# Patient Record
Sex: Female | Born: 1960
Health system: Southern US, Community
[De-identification: ages and names within clinical notes are randomized; demographics above are authoritative.]

## PROBLEM LIST (undated history)

## (undated) DIAGNOSIS — K219 Gastro-esophageal reflux disease without esophagitis: Secondary | ICD-10-CM

## (undated) DIAGNOSIS — F419 Anxiety disorder, unspecified: Secondary | ICD-10-CM

## (undated) DIAGNOSIS — I10 Essential (primary) hypertension: Secondary | ICD-10-CM

## (undated) DIAGNOSIS — E079 Disorder of thyroid, unspecified: Secondary | ICD-10-CM

## (undated) DIAGNOSIS — M1990 Unspecified osteoarthritis, unspecified site: Secondary | ICD-10-CM

## (undated) DIAGNOSIS — F32A Depression, unspecified: Secondary | ICD-10-CM

## (undated) DIAGNOSIS — M2140 Flat foot [pes planus] (acquired), unspecified foot: Secondary | ICD-10-CM

## (undated) DIAGNOSIS — E785 Hyperlipidemia, unspecified: Secondary | ICD-10-CM

## (undated) DIAGNOSIS — R7989 Other specified abnormal findings of blood chemistry: Secondary | ICD-10-CM

## (undated) DIAGNOSIS — F329 Major depressive disorder, single episode, unspecified: Secondary | ICD-10-CM

## (undated) HISTORY — DX: Anxiety disorder, unspecified: F41.9

## (undated) HISTORY — DX: Other specified abnormal findings of blood chemistry: R79.89

## (undated) HISTORY — DX: Unspecified osteoarthritis, unspecified site: M19.90

## (undated) HISTORY — DX: Flat foot (pes planus) (acquired), unspecified foot: M21.40

## (undated) HISTORY — DX: Gastro-esophageal reflux disease without esophagitis: K21.9

---

## 1997-10-30 ENCOUNTER — Emergency Department (HOSPITAL_COMMUNITY): Admission: EM | Admit: 1997-10-30 | Discharge: 1997-10-30 | Payer: Self-pay | Admitting: Emergency Medicine

## 1998-12-30 ENCOUNTER — Emergency Department (HOSPITAL_COMMUNITY): Admission: EM | Admit: 1998-12-30 | Discharge: 1998-12-30 | Payer: Self-pay | Admitting: Emergency Medicine

## 2000-01-21 ENCOUNTER — Ambulatory Visit (HOSPITAL_COMMUNITY): Admission: RE | Admit: 2000-01-21 | Discharge: 2000-01-21 | Payer: Self-pay | Admitting: *Deleted

## 2000-10-24 ENCOUNTER — Ambulatory Visit (HOSPITAL_COMMUNITY): Admission: RE | Admit: 2000-10-24 | Discharge: 2000-10-24 | Payer: Self-pay | Admitting: Family Medicine

## 2001-04-26 ENCOUNTER — Encounter: Payer: Self-pay | Admitting: Family Medicine

## 2001-04-26 ENCOUNTER — Ambulatory Visit (HOSPITAL_COMMUNITY): Admission: RE | Admit: 2001-04-26 | Discharge: 2001-04-26 | Payer: Self-pay | Admitting: Family Medicine

## 2002-07-02 ENCOUNTER — Ambulatory Visit (HOSPITAL_COMMUNITY): Admission: RE | Admit: 2002-07-02 | Discharge: 2002-07-02 | Payer: Self-pay | Admitting: Internal Medicine

## 2002-07-02 ENCOUNTER — Encounter: Payer: Self-pay | Admitting: Internal Medicine

## 2003-02-20 ENCOUNTER — Encounter: Admission: RE | Admit: 2003-02-20 | Discharge: 2003-02-20 | Payer: Self-pay | Admitting: Sports Medicine

## 2003-03-21 ENCOUNTER — Encounter: Admission: RE | Admit: 2003-03-21 | Discharge: 2003-03-21 | Payer: Self-pay | Admitting: Family Medicine

## 2003-04-02 ENCOUNTER — Ambulatory Visit (HOSPITAL_COMMUNITY): Admission: RE | Admit: 2003-04-02 | Discharge: 2003-04-02 | Payer: Self-pay | Admitting: Sports Medicine

## 2003-04-10 ENCOUNTER — Encounter: Admission: RE | Admit: 2003-04-10 | Discharge: 2003-04-10 | Payer: Self-pay | Admitting: Sports Medicine

## 2003-08-07 ENCOUNTER — Encounter: Admission: RE | Admit: 2003-08-07 | Discharge: 2003-08-07 | Payer: Self-pay | Admitting: Family Medicine

## 2003-09-10 ENCOUNTER — Encounter: Admission: RE | Admit: 2003-09-10 | Discharge: 2003-09-10 | Payer: Self-pay | Admitting: Family Medicine

## 2003-10-10 ENCOUNTER — Ambulatory Visit: Payer: Self-pay | Admitting: Family Medicine

## 2003-11-13 ENCOUNTER — Ambulatory Visit: Payer: Self-pay | Admitting: Family Medicine

## 2003-12-26 ENCOUNTER — Ambulatory Visit: Payer: Self-pay | Admitting: Family Medicine

## 2004-06-01 ENCOUNTER — Ambulatory Visit: Payer: Self-pay | Admitting: Family Medicine

## 2004-06-15 ENCOUNTER — Ambulatory Visit (HOSPITAL_COMMUNITY): Admission: RE | Admit: 2004-06-15 | Discharge: 2004-06-15 | Payer: Self-pay | Admitting: Family Medicine

## 2004-08-10 ENCOUNTER — Ambulatory Visit: Payer: Self-pay | Admitting: Sports Medicine

## 2004-09-22 ENCOUNTER — Ambulatory Visit: Payer: Self-pay | Admitting: Family Medicine

## 2004-10-28 ENCOUNTER — Ambulatory Visit: Payer: Self-pay | Admitting: Family Medicine

## 2005-02-22 ENCOUNTER — Ambulatory Visit: Payer: Self-pay | Admitting: Sports Medicine

## 2005-04-12 ENCOUNTER — Ambulatory Visit: Payer: Self-pay | Admitting: Family Medicine

## 2005-07-06 ENCOUNTER — Ambulatory Visit: Payer: Self-pay | Admitting: Family Medicine

## 2005-08-23 ENCOUNTER — Encounter (INDEPENDENT_AMBULATORY_CARE_PROVIDER_SITE_OTHER): Payer: Self-pay | Admitting: *Deleted

## 2005-08-24 ENCOUNTER — Ambulatory Visit: Payer: Self-pay | Admitting: Family Medicine

## 2005-08-24 ENCOUNTER — Ambulatory Visit (HOSPITAL_COMMUNITY): Admission: RE | Admit: 2005-08-24 | Discharge: 2005-08-24 | Payer: Self-pay | Admitting: Family Medicine

## 2005-10-12 ENCOUNTER — Ambulatory Visit: Payer: Self-pay | Admitting: Family Medicine

## 2005-11-14 ENCOUNTER — Ambulatory Visit: Payer: Self-pay | Admitting: Sports Medicine

## 2005-12-15 ENCOUNTER — Ambulatory Visit: Payer: Self-pay | Admitting: Family Medicine

## 2006-01-03 ENCOUNTER — Ambulatory Visit: Payer: Self-pay | Admitting: Sports Medicine

## 2006-01-23 ENCOUNTER — Ambulatory Visit (HOSPITAL_COMMUNITY): Admission: RE | Admit: 2006-01-23 | Discharge: 2006-01-23 | Payer: Self-pay | Admitting: Family Medicine

## 2006-01-23 ENCOUNTER — Encounter: Payer: Self-pay | Admitting: Family Medicine

## 2006-01-23 ENCOUNTER — Ambulatory Visit: Payer: Self-pay | Admitting: Family Medicine

## 2006-01-23 LAB — CONVERTED CEMR LAB
BUN: 10 mg/dL (ref 6–23)
Calcium: 9 mg/dL (ref 8.4–10.5)
Creatinine, Ser: 0.84 mg/dL (ref 0.40–1.20)
Sodium: 136 meq/L (ref 135–145)
TSH: 0.405 microintl units/mL (ref 0.350–5.50)

## 2006-02-22 ENCOUNTER — Ambulatory Visit: Payer: Self-pay | Admitting: Family Medicine

## 2006-02-22 ENCOUNTER — Encounter: Payer: Self-pay | Admitting: Family Medicine

## 2006-02-22 LAB — CONVERTED CEMR LAB
Cholesterol: 165 mg/dL (ref 0–200)
Triglycerides: 62 mg/dL (ref <150)

## 2006-03-16 DIAGNOSIS — N951 Menopausal and female climacteric states: Secondary | ICD-10-CM

## 2006-03-16 DIAGNOSIS — I10 Essential (primary) hypertension: Secondary | ICD-10-CM | POA: Insufficient documentation

## 2006-03-16 DIAGNOSIS — F339 Major depressive disorder, recurrent, unspecified: Secondary | ICD-10-CM | POA: Insufficient documentation

## 2006-03-16 DIAGNOSIS — E669 Obesity, unspecified: Secondary | ICD-10-CM | POA: Insufficient documentation

## 2006-03-16 HISTORY — DX: Menopausal and female climacteric states: N95.1

## 2006-03-17 ENCOUNTER — Encounter (INDEPENDENT_AMBULATORY_CARE_PROVIDER_SITE_OTHER): Payer: Self-pay | Admitting: *Deleted

## 2006-04-24 ENCOUNTER — Telehealth: Payer: Self-pay | Admitting: *Deleted

## 2006-04-25 ENCOUNTER — Emergency Department (HOSPITAL_COMMUNITY): Admission: EM | Admit: 2006-04-25 | Discharge: 2006-04-25 | Payer: Self-pay | Admitting: Emergency Medicine

## 2006-05-15 ENCOUNTER — Telehealth: Payer: Self-pay | Admitting: *Deleted

## 2006-05-18 ENCOUNTER — Telehealth: Payer: Self-pay | Admitting: *Deleted

## 2006-05-25 ENCOUNTER — Telehealth (INDEPENDENT_AMBULATORY_CARE_PROVIDER_SITE_OTHER): Payer: Self-pay | Admitting: *Deleted

## 2006-05-26 ENCOUNTER — Ambulatory Visit: Payer: Self-pay | Admitting: Sports Medicine

## 2006-06-29 ENCOUNTER — Encounter: Payer: Self-pay | Admitting: Family Medicine

## 2006-07-31 ENCOUNTER — Ambulatory Visit: Payer: Self-pay | Admitting: Family Medicine

## 2006-07-31 ENCOUNTER — Telehealth: Payer: Self-pay | Admitting: *Deleted

## 2006-08-14 ENCOUNTER — Telehealth (INDEPENDENT_AMBULATORY_CARE_PROVIDER_SITE_OTHER): Payer: Self-pay | Admitting: *Deleted

## 2006-08-28 ENCOUNTER — Telehealth (INDEPENDENT_AMBULATORY_CARE_PROVIDER_SITE_OTHER): Payer: Self-pay | Admitting: *Deleted

## 2006-08-28 ENCOUNTER — Encounter (INDEPENDENT_AMBULATORY_CARE_PROVIDER_SITE_OTHER): Payer: Self-pay | Admitting: Family Medicine

## 2006-08-28 ENCOUNTER — Ambulatory Visit: Payer: Self-pay | Admitting: Family Medicine

## 2006-08-28 ENCOUNTER — Encounter: Payer: Self-pay | Admitting: *Deleted

## 2006-08-30 ENCOUNTER — Encounter (INDEPENDENT_AMBULATORY_CARE_PROVIDER_SITE_OTHER): Payer: Self-pay | Admitting: Family Medicine

## 2006-08-31 ENCOUNTER — Encounter (INDEPENDENT_AMBULATORY_CARE_PROVIDER_SITE_OTHER): Payer: Self-pay | Admitting: Family Medicine

## 2006-08-31 LAB — CONVERTED CEMR LAB
BUN: 16 mg/dL (ref 6–23)
CO2: 28 meq/L (ref 19–32)
Calcium: 9.6 mg/dL (ref 8.4–10.5)
Hemoglobin: 11.6 g/dL — ABNORMAL LOW (ref 12.0–15.0)
MCV: 83.6 fL (ref 78.0–100.0)
Potassium: 3.5 meq/L (ref 3.5–5.3)
RBC: 4.38 M/uL (ref 3.87–5.11)
Sodium: 140 meq/L (ref 135–145)
TSH: 0.319 microintl units/mL — ABNORMAL LOW (ref 0.350–5.50)
WBC: 8.4 10*3/uL (ref 4.0–10.5)

## 2006-09-13 ENCOUNTER — Telehealth: Payer: Self-pay | Admitting: *Deleted

## 2006-09-25 ENCOUNTER — Telehealth: Payer: Self-pay | Admitting: *Deleted

## 2006-09-25 ENCOUNTER — Ambulatory Visit: Payer: Self-pay | Admitting: Family Medicine

## 2006-09-25 ENCOUNTER — Encounter (INDEPENDENT_AMBULATORY_CARE_PROVIDER_SITE_OTHER): Payer: Self-pay | Admitting: Family Medicine

## 2006-09-25 ENCOUNTER — Encounter: Payer: Self-pay | Admitting: *Deleted

## 2006-09-25 LAB — CONVERTED CEMR LAB
Potassium: 3.7 meq/L (ref 3.5–5.3)
Sodium: 138 meq/L (ref 135–145)

## 2006-09-26 ENCOUNTER — Telehealth: Payer: Self-pay | Admitting: *Deleted

## 2006-09-28 ENCOUNTER — Telehealth: Payer: Self-pay | Admitting: *Deleted

## 2006-10-04 ENCOUNTER — Encounter: Payer: Self-pay | Admitting: Family Medicine

## 2006-10-04 ENCOUNTER — Ambulatory Visit: Payer: Self-pay | Admitting: Family Medicine

## 2006-10-05 ENCOUNTER — Encounter: Payer: Self-pay | Admitting: Family Medicine

## 2006-10-17 ENCOUNTER — Telehealth: Payer: Self-pay | Admitting: *Deleted

## 2006-10-19 ENCOUNTER — Ambulatory Visit: Payer: Self-pay | Admitting: Family Medicine

## 2006-10-19 ENCOUNTER — Telehealth (INDEPENDENT_AMBULATORY_CARE_PROVIDER_SITE_OTHER): Payer: Self-pay | Admitting: *Deleted

## 2006-10-19 ENCOUNTER — Encounter (INDEPENDENT_AMBULATORY_CARE_PROVIDER_SITE_OTHER): Payer: Self-pay | Admitting: Family Medicine

## 2006-10-30 ENCOUNTER — Ambulatory Visit (HOSPITAL_COMMUNITY): Admission: RE | Admit: 2006-10-30 | Discharge: 2006-10-30 | Payer: Self-pay | Admitting: Family Medicine

## 2006-11-03 ENCOUNTER — Encounter: Payer: Self-pay | Admitting: Family Medicine

## 2006-11-21 ENCOUNTER — Telehealth: Payer: Self-pay | Admitting: *Deleted

## 2006-11-23 ENCOUNTER — Ambulatory Visit: Payer: Self-pay | Admitting: Family Medicine

## 2006-11-23 ENCOUNTER — Encounter: Payer: Self-pay | Admitting: Family Medicine

## 2006-11-23 LAB — CONVERTED CEMR LAB
Chlamydia, DNA Probe: NEGATIVE
GC Probe Amp, Genital: NEGATIVE
HCT: 41.3 % (ref 36.0–46.0)
Hemoglobin: 13.1 g/dL (ref 12.0–15.0)
MCHC: 31.7 g/dL (ref 30.0–36.0)
MCV: 87.5 fL (ref 78.0–100.0)
Platelets: 280 10*3/uL (ref 150–400)
RBC: 4.72 M/uL (ref 3.87–5.11)
RDW: 15.6 % — ABNORMAL HIGH (ref 11.5–14.0)

## 2006-11-28 ENCOUNTER — Encounter: Payer: Self-pay | Admitting: Family Medicine

## 2007-02-09 ENCOUNTER — Telehealth (INDEPENDENT_AMBULATORY_CARE_PROVIDER_SITE_OTHER): Payer: Self-pay | Admitting: *Deleted

## 2007-02-13 ENCOUNTER — Ambulatory Visit: Payer: Self-pay | Admitting: Family Medicine

## 2007-03-09 ENCOUNTER — Telehealth: Payer: Self-pay | Admitting: *Deleted

## 2007-03-13 ENCOUNTER — Ambulatory Visit: Payer: Self-pay | Admitting: Family Medicine

## 2007-04-10 ENCOUNTER — Ambulatory Visit: Payer: Self-pay | Admitting: Family Medicine

## 2007-04-10 ENCOUNTER — Encounter: Payer: Self-pay | Admitting: Family Medicine

## 2007-04-10 ENCOUNTER — Ambulatory Visit (HOSPITAL_COMMUNITY): Admission: RE | Admit: 2007-04-10 | Discharge: 2007-04-10 | Payer: Self-pay | Admitting: Family Medicine

## 2007-04-10 LAB — CONVERTED CEMR LAB
Calcium: 8.8 mg/dL (ref 8.4–10.5)
Creatinine, Ser: 0.71 mg/dL (ref 0.40–1.20)
Free T4: 0.98 ng/dL (ref 0.89–1.80)
Glucose, Bld: 80 mg/dL (ref 70–99)
T3, Free: 2.6 pg/mL (ref 2.3–4.2)
TSH: 0.135 microintl units/mL — ABNORMAL LOW (ref 0.350–5.50)

## 2007-04-19 ENCOUNTER — Encounter: Payer: Self-pay | Admitting: Family Medicine

## 2007-05-14 ENCOUNTER — Encounter: Payer: Self-pay | Admitting: Family Medicine

## 2007-07-09 ENCOUNTER — Ambulatory Visit: Payer: Self-pay | Admitting: Sports Medicine

## 2007-07-18 ENCOUNTER — Encounter: Payer: Self-pay | Admitting: Family Medicine

## 2007-07-18 ENCOUNTER — Ambulatory Visit: Payer: Self-pay | Admitting: Family Medicine

## 2007-07-18 LAB — CONVERTED CEMR LAB
HDL: 46 mg/dL (ref >39)
Triglycerides: 47 mg/dL (ref <150)
VLDL: 9 mg/dL (ref 0–40)

## 2007-07-19 ENCOUNTER — Encounter: Payer: Self-pay | Admitting: Family Medicine

## 2007-10-12 ENCOUNTER — Telehealth (INDEPENDENT_AMBULATORY_CARE_PROVIDER_SITE_OTHER): Payer: Self-pay | Admitting: *Deleted

## 2007-10-15 ENCOUNTER — Ambulatory Visit: Payer: Self-pay | Admitting: Family Medicine

## 2007-10-23 ENCOUNTER — Encounter: Payer: Self-pay | Admitting: Family Medicine

## 2007-11-01 ENCOUNTER — Encounter: Payer: Self-pay | Admitting: Family Medicine

## 2007-11-01 ENCOUNTER — Ambulatory Visit (HOSPITAL_COMMUNITY): Admission: RE | Admit: 2007-11-01 | Discharge: 2007-11-01 | Payer: Self-pay | Admitting: Family Medicine

## 2007-11-01 ENCOUNTER — Ambulatory Visit: Payer: Self-pay | Admitting: Family Medicine

## 2007-11-01 DIAGNOSIS — N938 Other specified abnormal uterine and vaginal bleeding: Secondary | ICD-10-CM | POA: Insufficient documentation

## 2007-11-01 DIAGNOSIS — N949 Unspecified condition associated with female genital organs and menstrual cycle: Secondary | ICD-10-CM

## 2007-11-06 ENCOUNTER — Telehealth: Payer: Self-pay | Admitting: *Deleted

## 2007-11-13 ENCOUNTER — Encounter: Admission: RE | Admit: 2007-11-13 | Discharge: 2007-11-13 | Payer: Self-pay | Admitting: Family Medicine

## 2007-11-21 LAB — CONVERTED CEMR LAB
HCT: 41.9 % (ref 36.0–46.0)
Hemoglobin: 13 g/dL (ref 12.0–15.0)
RBC: 4.71 M/uL (ref 3.87–5.11)

## 2007-12-04 ENCOUNTER — Ambulatory Visit (HOSPITAL_COMMUNITY): Admission: RE | Admit: 2007-12-04 | Discharge: 2007-12-04 | Payer: Self-pay | Admitting: Family Medicine

## 2007-12-25 ENCOUNTER — Encounter: Payer: Self-pay | Admitting: Family Medicine

## 2007-12-25 ENCOUNTER — Encounter (INDEPENDENT_AMBULATORY_CARE_PROVIDER_SITE_OTHER): Payer: Self-pay | Admitting: Family Medicine

## 2007-12-25 ENCOUNTER — Ambulatory Visit: Payer: Self-pay | Admitting: Family Medicine

## 2007-12-28 ENCOUNTER — Telehealth: Payer: Self-pay | Admitting: *Deleted

## 2008-01-14 ENCOUNTER — Encounter: Payer: Self-pay | Admitting: Family Medicine

## 2008-01-24 ENCOUNTER — Encounter: Payer: Self-pay | Admitting: Family Medicine

## 2008-01-24 ENCOUNTER — Ambulatory Visit: Payer: Self-pay | Admitting: Family Medicine

## 2008-01-24 LAB — CONVERTED CEMR LAB
FSH: 6.8 milliintl units/mL
Hemoglobin: 12.4 g/dL (ref 12.0–15.0)
MCHC: 31.9 g/dL (ref 30.0–36.0)
MCV: 87 fL (ref 78.0–100.0)
Platelets: 279 10*3/uL (ref 150–400)

## 2008-01-25 ENCOUNTER — Encounter: Payer: Self-pay | Admitting: Family Medicine

## 2008-01-28 ENCOUNTER — Telehealth: Payer: Self-pay | Admitting: *Deleted

## 2008-04-26 ENCOUNTER — Emergency Department (HOSPITAL_COMMUNITY): Admission: EM | Admit: 2008-04-26 | Discharge: 2008-04-26 | Payer: Self-pay | Admitting: Emergency Medicine

## 2008-04-28 ENCOUNTER — Telehealth: Payer: Self-pay | Admitting: *Deleted

## 2008-05-20 ENCOUNTER — Encounter: Payer: Self-pay | Admitting: Family Medicine

## 2008-05-20 ENCOUNTER — Encounter: Admission: RE | Admit: 2008-05-20 | Discharge: 2008-05-20 | Payer: Self-pay | Admitting: Family Medicine

## 2008-05-20 ENCOUNTER — Ambulatory Visit: Payer: Self-pay | Admitting: Family Medicine

## 2008-05-20 DIAGNOSIS — R928 Other abnormal and inconclusive findings on diagnostic imaging of breast: Secondary | ICD-10-CM | POA: Insufficient documentation

## 2008-06-06 ENCOUNTER — Telehealth: Payer: Self-pay | Admitting: *Deleted

## 2008-08-06 ENCOUNTER — Telehealth: Payer: Self-pay | Admitting: Family Medicine

## 2008-08-12 ENCOUNTER — Ambulatory Visit: Payer: Self-pay | Admitting: Family Medicine

## 2008-09-01 ENCOUNTER — Ambulatory Visit: Payer: Self-pay | Admitting: Family Medicine

## 2008-09-01 ENCOUNTER — Encounter: Payer: Self-pay | Admitting: Family Medicine

## 2008-09-10 LAB — CONVERTED CEMR LAB
BUN: 8 mg/dL (ref 6–23)
CO2: 25 meq/L (ref 19–32)
Calcium: 8.8 mg/dL (ref 8.4–10.5)
Chloride: 106 meq/L (ref 96–112)
Creatinine, Ser: 0.64 mg/dL (ref 0.40–1.20)
Sodium: 140 meq/L (ref 135–145)

## 2008-09-11 ENCOUNTER — Telehealth: Payer: Self-pay | Admitting: Family Medicine

## 2008-09-12 ENCOUNTER — Telehealth: Payer: Self-pay | Admitting: Family Medicine

## 2008-09-12 ENCOUNTER — Emergency Department (HOSPITAL_COMMUNITY): Admission: EM | Admit: 2008-09-12 | Discharge: 2008-09-12 | Payer: Self-pay | Admitting: Family Medicine

## 2008-09-24 ENCOUNTER — Encounter: Payer: Self-pay | Admitting: Family Medicine

## 2008-10-03 ENCOUNTER — Telehealth: Payer: Self-pay | Admitting: Family Medicine

## 2008-11-18 ENCOUNTER — Encounter: Payer: Self-pay | Admitting: Family Medicine

## 2008-11-18 ENCOUNTER — Encounter: Admission: RE | Admit: 2008-11-18 | Discharge: 2008-11-18 | Payer: Self-pay | Admitting: Family Medicine

## 2008-11-18 ENCOUNTER — Ambulatory Visit: Payer: Self-pay | Admitting: Family Medicine

## 2008-11-18 DIAGNOSIS — E785 Hyperlipidemia, unspecified: Secondary | ICD-10-CM | POA: Insufficient documentation

## 2008-12-05 ENCOUNTER — Telehealth: Payer: Self-pay | Admitting: Family Medicine

## 2008-12-15 ENCOUNTER — Telehealth: Payer: Self-pay | Admitting: Family Medicine

## 2008-12-30 ENCOUNTER — Encounter: Payer: Self-pay | Admitting: Family Medicine

## 2008-12-30 ENCOUNTER — Ambulatory Visit: Payer: Self-pay | Admitting: Family Medicine

## 2008-12-30 ENCOUNTER — Encounter: Payer: Self-pay | Admitting: *Deleted

## 2009-01-15 LAB — CONVERTED CEMR LAB
ALT: <8 units/L (ref 0–35)
Alkaline Phosphatase: 72 units/L (ref 39–117)
CO2: 20 meq/L (ref 19–32)
Calcium: 8.7 mg/dL (ref 8.4–10.5)
Chloride: 107 meq/L (ref 96–112)
Cholesterol: 176 mg/dL (ref 0–200)
LDL Cholesterol: 127 mg/dL — ABNORMAL HIGH (ref 0–99)
Potassium: 4.3 meq/L (ref 3.5–5.3)
Total Protein: 6.8 g/dL (ref 6.0–8.3)
Triglycerides: 42 mg/dL (ref <150)

## 2009-03-02 ENCOUNTER — Telehealth (INDEPENDENT_AMBULATORY_CARE_PROVIDER_SITE_OTHER): Payer: Self-pay | Admitting: *Deleted

## 2009-04-07 ENCOUNTER — Ambulatory Visit: Payer: Self-pay | Admitting: Family Medicine

## 2009-04-07 ENCOUNTER — Encounter: Payer: Self-pay | Admitting: Family Medicine

## 2009-04-08 LAB — CONVERTED CEMR LAB
Albumin: 4.1 g/dL (ref 3.5–5.2)
Alkaline Phosphatase: 54 units/L (ref 39–117)
CO2: 21 meq/L (ref 19–32)
Chloride: 103 meq/L (ref 96–112)
Creatinine, Ser: 0.69 mg/dL (ref 0.40–1.20)
HCT: 38.5 % (ref 36.0–46.0)
Hemoglobin: 12.4 g/dL (ref 12.0–15.0)
MCHC: 32.2 g/dL (ref 30.0–36.0)
Platelets: 260 10*3/uL (ref 150–400)
RDW: 14.1 % (ref 11.5–15.5)
Total Bilirubin: 0.5 mg/dL (ref 0.3–1.2)

## 2009-04-09 ENCOUNTER — Telehealth: Payer: Self-pay | Admitting: *Deleted

## 2009-04-13 ENCOUNTER — Ambulatory Visit: Payer: Self-pay | Admitting: Family Medicine

## 2009-04-13 DIAGNOSIS — M25562 Pain in left knee: Secondary | ICD-10-CM | POA: Insufficient documentation

## 2009-04-13 DIAGNOSIS — M25569 Pain in unspecified knee: Secondary | ICD-10-CM | POA: Insufficient documentation

## 2009-06-08 ENCOUNTER — Ambulatory Visit: Payer: Self-pay | Admitting: Family Medicine

## 2009-10-05 ENCOUNTER — Telehealth: Payer: Self-pay | Admitting: Family Medicine

## 2009-10-06 ENCOUNTER — Ambulatory Visit: Payer: Self-pay | Admitting: Family Medicine

## 2009-10-14 ENCOUNTER — Encounter: Payer: Self-pay | Admitting: Family Medicine

## 2009-10-14 ENCOUNTER — Ambulatory Visit: Payer: Self-pay | Admitting: Family Medicine

## 2009-10-18 ENCOUNTER — Encounter: Payer: Self-pay | Admitting: Family Medicine

## 2009-10-18 LAB — CONVERTED CEMR LAB: TSH: 0.17 microintl units/mL — ABNORMAL LOW (ref 0.350–4.500)

## 2009-11-12 ENCOUNTER — Encounter: Payer: Self-pay | Admitting: Family Medicine

## 2009-11-12 ENCOUNTER — Ambulatory Visit: Payer: Self-pay | Admitting: Family Medicine

## 2009-11-13 ENCOUNTER — Encounter: Payer: Self-pay | Admitting: Family Medicine

## 2009-11-13 LAB — CONVERTED CEMR LAB: T3, Free: 2.8 pg/mL (ref 2.3–4.2)

## 2009-11-26 ENCOUNTER — Ambulatory Visit: Payer: Self-pay | Admitting: Family Medicine

## 2009-11-29 ENCOUNTER — Encounter: Payer: Self-pay | Admitting: Family Medicine

## 2009-11-30 ENCOUNTER — Ambulatory Visit: Payer: Self-pay | Admitting: Family Medicine

## 2009-11-30 ENCOUNTER — Ambulatory Visit (HOSPITAL_COMMUNITY): Admission: RE | Admit: 2009-11-30 | Discharge: 2009-11-30 | Payer: Self-pay | Admitting: Family Medicine

## 2009-11-30 IMAGING — MG MM DIGITAL SCREENING
5 series · 5 of 5 positions shown · non-contrast
Comparison: Prior studies.

DG SCREEN MAMMOGRAM BILATERAL
Bilateral CC and MLO view(s) were taken.
Technologist: NYA, RT RM
Prior study comparison: [DATE], DG screen mammogram bilateral.

DIGITAL SCREENING MAMMOGRAM WITH CAD:

[R CC]
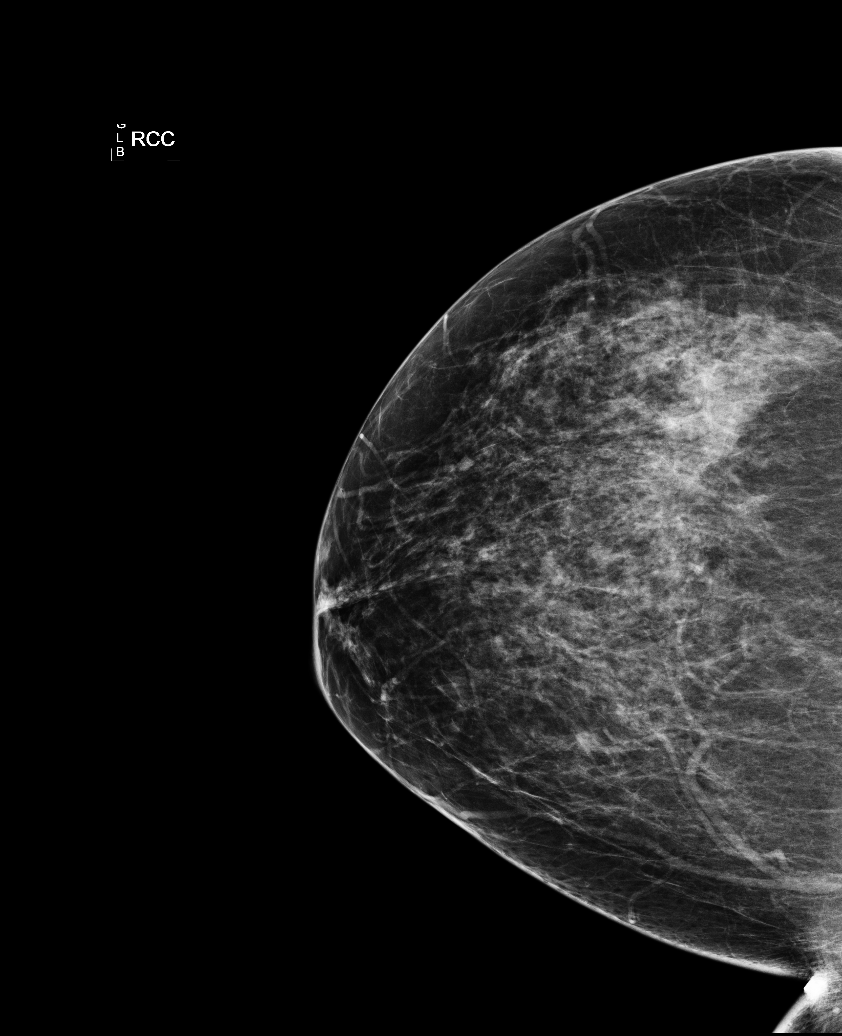

[R MLO]
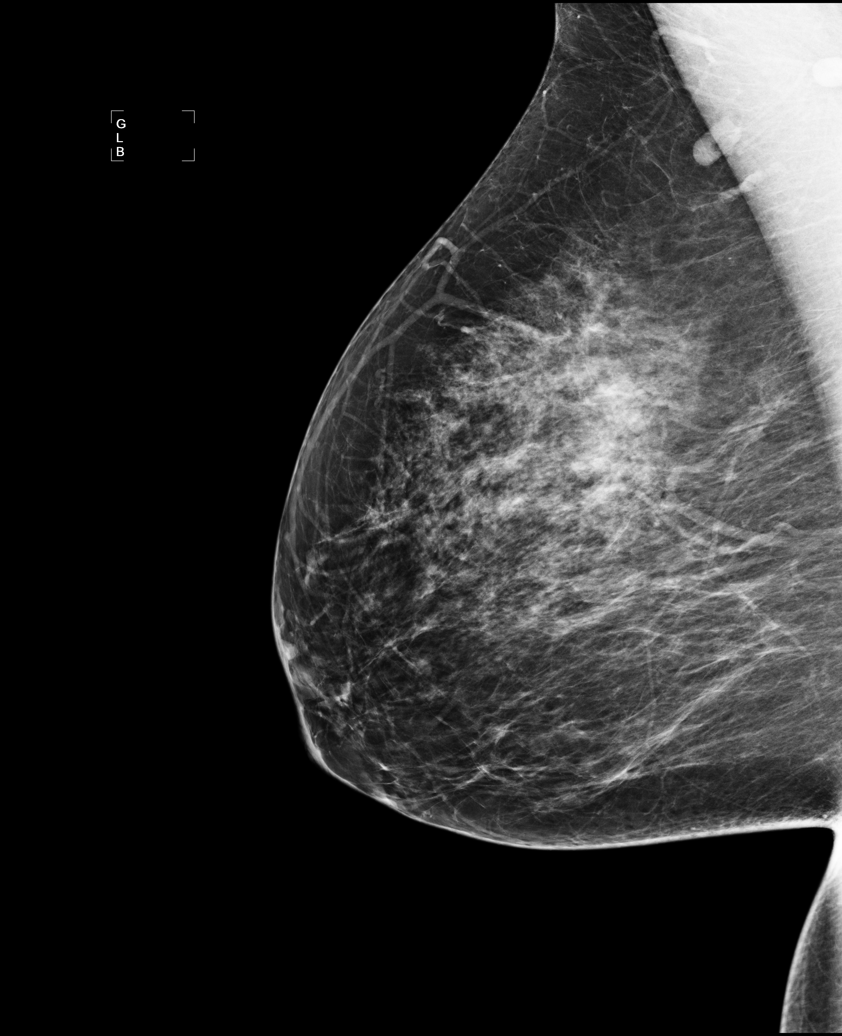

[L CC]
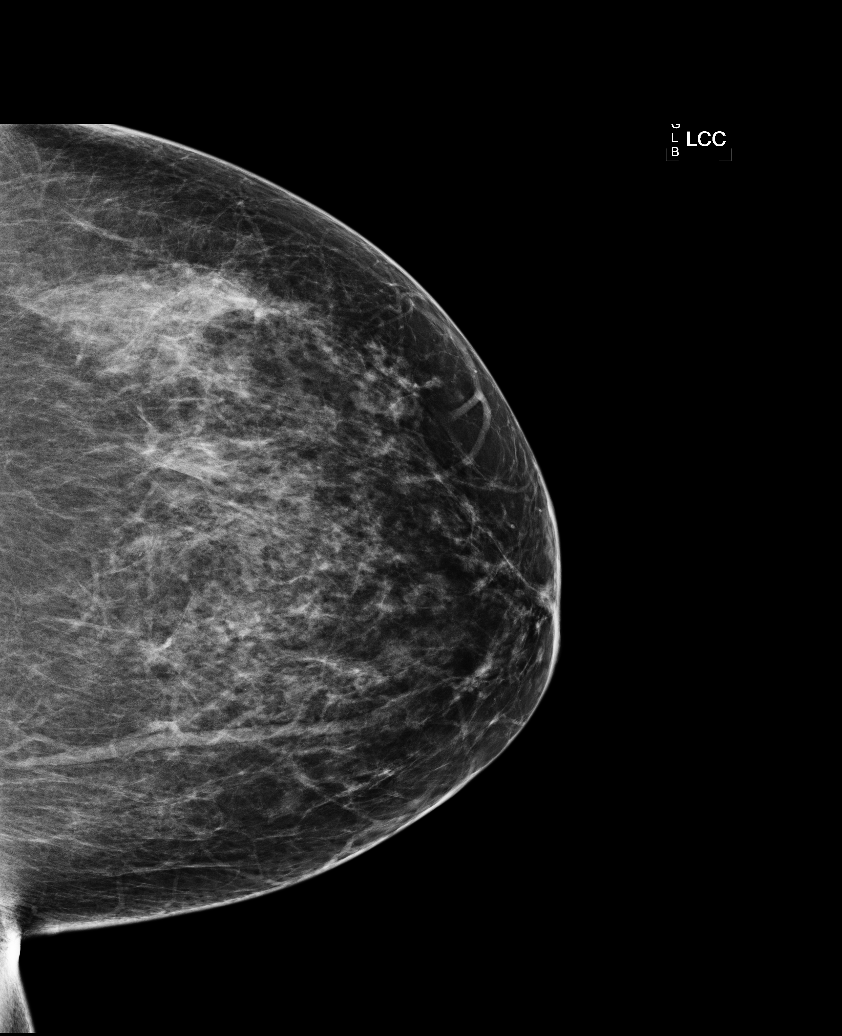

[L MLO (1 of 2)]
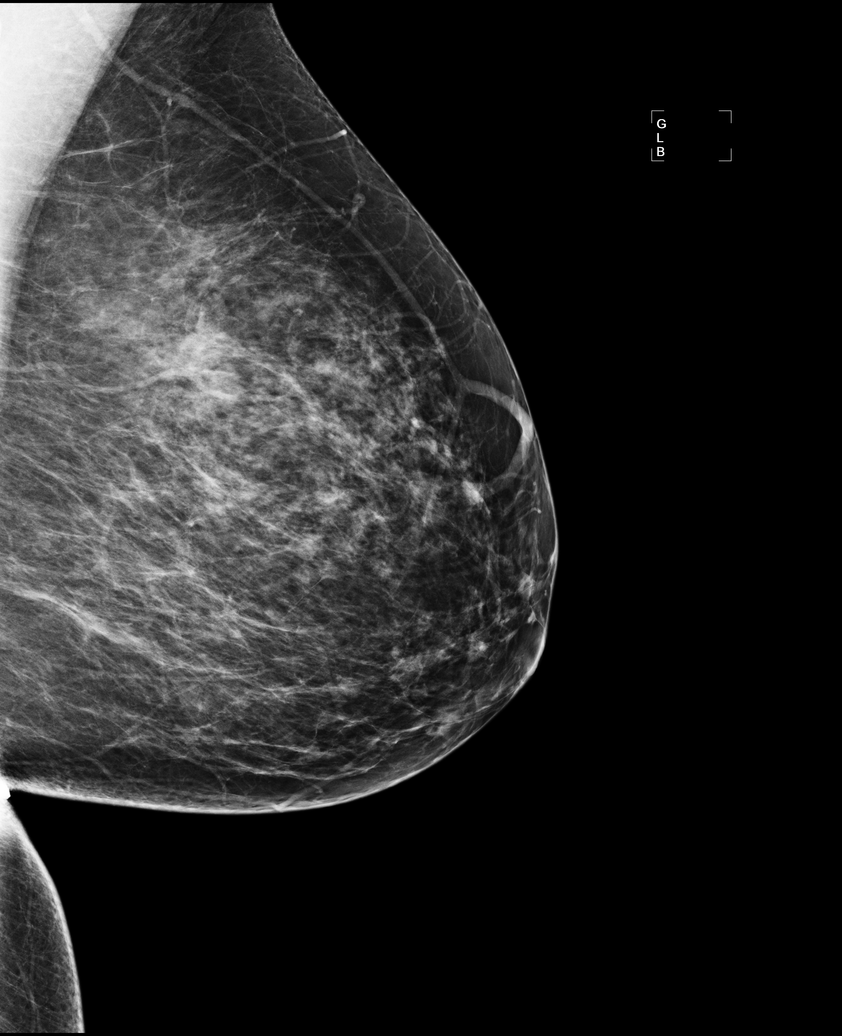

[L MLO (2 of 2)]
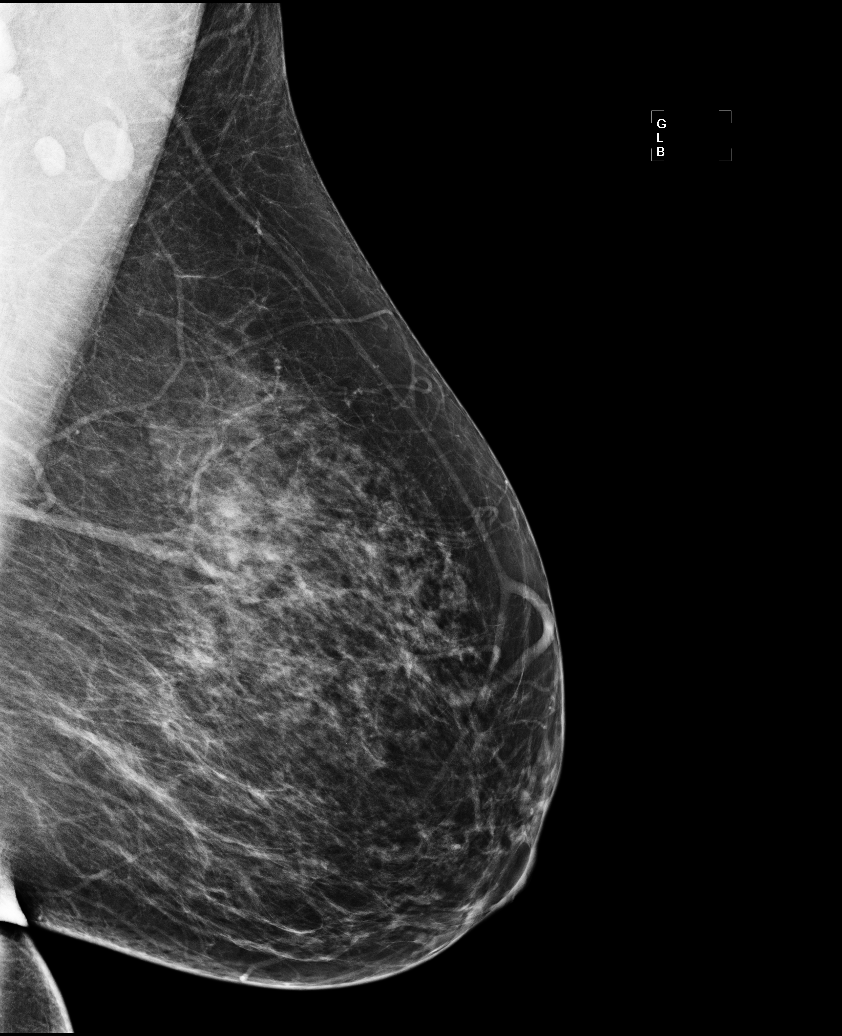

[5 of 5 positions shown; findings below may reference images not displayed]

There are scattered fibroglandular densities.  There is no dominant mass, architectural distortion 
or calcification to suggest malignancy.

Images were processed with CAD.
IMPRESSION: No mammographic evidence of malignancy.  Suggest yearly screening mammography.

A result letter of this screening mammogram will be mailed directly to the patient.

ASSESSMENT: Negative - BI-RADS 1

Screening mammogram in 1 year.
,

## 2009-12-03 ENCOUNTER — Encounter: Payer: Self-pay | Admitting: Family Medicine

## 2010-02-12 ENCOUNTER — Ambulatory Visit: Admission: RE | Admit: 2010-02-12 | Discharge: 2010-02-12 | Payer: Self-pay | Source: Home / Self Care

## 2010-02-12 DIAGNOSIS — R1084 Generalized abdominal pain: Secondary | ICD-10-CM | POA: Insufficient documentation

## 2010-02-18 NOTE — Progress Notes (Signed)
Summary: Rx Prob  Phone Note Refill Request Call back at Home Phone [phone] Message from:  Patient  Her cholesterol pill was supposed to be generic and it was not.  Can this be corrected.  [facility] [address]  Initial call taken by: Clydell Hakim,  March 02, 2009 9:59 AM  Follow-up for Phone Call        will forward to MD. Follow-up by: Theresia Lo RN,  March 02, 2009 10:23 AM  Additional Follow-up for Phone Call Additional follow up Details #1::        spoke with pharmacist and she states the simvastatin is $18.00. patient was switched from pravochol to simvastatin 01/15/2010. notified patient and explained that this is the generic brand for Zocor. she was used to paying $4.00 for the pravachol. she understands now. Additional Follow-up by: Theresia Lo RN,  March 02, 2009 11:56 AM

## 2010-02-18 NOTE — Miscellaneous (Signed)
Summary: Letter notifying patient of TSH result  Clinical Lists Changes  Patient has low TSH, will send her a letter notifying her of results and asking her to make appointment to get free T3/T4 checked.   Orders: Added new Test order of Free T3-[facility] [phone]) - Signed Added new Test order of Free T4-[facility] [phone]) - Signed

## 2010-02-18 NOTE — Progress Notes (Signed)
Summary: Phone triage for pain, now improved in Tylenol.   Phone Note Call from Patient Call back at [phone]   Caller: Patient Summary of Call: Pt has had migraine for 3 days now. Initial call taken by: Clydell Hakim,  October 05, 2009 11:22 AM  Follow-up for Phone Call        no answer or vm. will try later. if pt calls back will offer work in appt Follow-up by: Golden Circle RN,  October 05, 2009 11:28 AM  Additional Follow-up for Phone Call Additional follow up Details #1::        Pt called back and the number she can be reached on is [phone] Additional Follow-up by: Clydell Hakim,  October 05, 2009 1:46 PM    Additional Follow-up for Phone Call Additional follow up Details #2::    Ms. Greenwell is off work and want to be called back on her cell phone [phone] Follow-up by: Abundio Miu  Additional Follow-up for Phone Call Additional follow up Details #3:: Details for Additional Follow-up Action Taken: states she is better now. states she has them alot. takes tylenol. this helps a little. appt for 8:30am tomorrow Additional Follow-up by: Golden Circle RN,  October 05, 2009 2:41 PM

## 2010-02-18 NOTE — Letter (Signed)
Summary: Generic Letter  [facility]  [address]   [place], [place] [address]   Phone: [phone]  Fax: [phone]    10/18/2009  JENELL DOBRANSKY [address] [place], [place]  [address]  Dear Ms. Tani,  The results of your thyroid test shows that your TSH (thyroid hormone) is lower than before. It was 0.170, but it was 0.182 in 2010. Normal values are from 0.350-4.50. Since it was low in 2010, your doctor checked another lab called a free T3 and T4 that gives [place] a better idea of your thyroid function. This was normal, and he felt that nothing else needed to be done unless you had symptoms of thyroid problems.  Since your most recent TSH was lower than before, I would like to check another free T3 and T4 again. Please call the clinic and make an appointment with the lab. I will notify you of the results. Also, when you call for the lab appointment, please notify the receptionist of a working number since the one you gave [place] before [phone] no longer works.      Sincerely,   Priscella Mann MD  Appended Document: Generic Letter mailed
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
## 2010-02-18 NOTE — Letter (Signed)
Summary: Generic Letter  [facility]  [address]   [place], [place] [address]   Phone: [phone]  Fax: [phone]    11/13/2009 MRN: [id number]  [address] [place], [place]  [address]  Dear Amber Willis,  I appreciate your coming in and having your labs drawn. Your thyroid tests are about the same as previous tests but are on the low side of normal, indicating possible borderline hypothyroidism. At this time, I do not feel anything needs to be done, but I would like you to follow-up with me in 4-6 weeks and have these labs drawn again. Please call the clinic and make an appointment.  Thank you Amber Willis. I look forward to seeing you then, and I hope you are doing well.      Sincerely,   Priscella Mann MD [facility]  Appended Document: Generic Letter mailed

## 2010-02-18 NOTE — Assessment & Plan Note (Signed)
Summary: knee pain,df   Vital Signs:  Patient profile:   50 year old female Height:      63 inches Weight:      196.50 pounds BMI:     34.93 BSA:     1.92 Temp:     97.8 degrees F Pulse rate:   54 / minute BP sitting:   127 / 89  Vitals Entered By: Jone Baseman CMA (November 26, 2009 4:11 PM) CC: R knee pain x 5 days Is Patient Diabetic? No Pain Assessment Patient in pain? yes     Location: right  Intensity: 8   Primary Amber Willis:  Amber Mann MD  CC:  R knee pain x 5 days.  History of Present Illness: Amber Willis presents complaining of knee pain since last Saturday.  She says she had no injury that she remembers.  She says it feels swollen in the morning but the pain is worse at night.  She says it has felt warm, but no erythema.  She says she has taken 2 extra strength tylenol at night, which helped a little.  She has iced her knee once.  [name] trys to walk a few miles every day, and this is interfereing with her walking.    Habits & Providers  Alcohol-Tobacco-Diet     Tobacco Status: never     Tobacco Counseling: not indicated; no tobacco use  Allergies: No Known Drug Allergies  Review of Systems  The patient denies anorexia, fever, chest pain, syncope, dyspnea on exertion, peripheral edema, and abdominal pain.    Physical Exam  General:  Well-developed,well-nourished,in no acute distress; alert,appropriate and cooperative throughout examination Head:  Normocephalic and atraumatic without obvious abnormalities.  Eyes:  No corneal or conjunctival inflammation noted. EOMI. Perrla. Mouth:  Oral mucosa and oropharynx without lesions or exudates.  Teeth in good repair. Lungs:  Normal respiratory effort, chest expands symmetrically. Lungs are clear to auscultation, no crackles or wheezes. Heart:  Normal rate and regular rhythm. S1 and S2 normal without gallop, murmur, click, rub or other extra sounds. Abdomen:  Bowel sounds positive,abdomen soft and  non-tender without masses, organomegaly or hernias noted. Msk:  Pt. with some mild effusion of right knee compared to left.  She has lateral right knee tenderness to palpation on the joint line.    On standing, pt. has a flat arch and varus.  Pulses:  2+ extremity pulses.    Impression & Recommendations:  Problem # 1:  KNEE PAIN, RIGHT (ICD-719.46) Pt most likely has OA. Will have her take Diclofenac, which helped in the past for her left knee pain.  I also advised she can take tylenol with this medication, but not Ibuprofen.  Gave pt. knee brace which she should wear during work.  Also advised icing knee every day after work. Will consider referral to sports medicine or ortho if pain does not improve.  The following medications were removed from the medication list:    Ibuprofen 800 Mg Tabs (Ibuprofen) .Marland Kitchen... Take 1 tab by mouth at that start of a migraine, may repeat in 6-8 hours Her updated medication list for this problem includes:    Diclofenac Sodium 75 Mg Tbec (Diclofenac sodium) ..... One by mouth two times a day for knee pain  Orders: [facility]- Est Level  3 (29528) Knee Sleeve- [facility] (U1324)  Problem # 2:  FLAT FOOT (ICD-734) This is likely contributing to pt's knee problems.  Advised her to invest in shoe inserts for flat arches.  Orders: [facility]- Est Level  3 ([address])  Complete Medication List: 1)  Lisinopril-hydrochlorothiazide 10-12.5 Mg Tabs (Lisinopril-hydrochlorothiazide) .... One by mouth daily 2)  Paroxetine Hcl 20 Mg Tabs (Paroxetine hcl) .... One by mouth daily 3)  Simvastatin 20 Mg Tabs (Simvastatin) .... One by mouth daily for cholesterol 4)  Diclofenac Sodium 75 Mg Tbec (Diclofenac sodium) .... One by mouth two times a day for knee pain  Patient Instructions: 1)  It was nice to meet you. 2)  Please try taking Diclofenac for your knee pain.  If you are still feeling pain, it is OK to take Tylenol, two extra strength pills up to three times a day.   3)  I want you to wear a  knee brace at work.  You should Ice your knee after your shift.  You should also try getting shoe insoles, try the kind for people with flat feet or flat arches.  Prescriptions: DICLOFENAC SODIUM 75 MG TBEC (DICLOFENAC SODIUM) one by mouth two times a day for knee pain  #60 x 2   Entered and Authorized by:   Ardyth Gal MD   Signed by:   Ardyth Gal MD on 11/26/2009   Method used:   Electronically to        [facility] [phone]* (retail)       [address]       [place], [place]  [address]       Ph: [phone]       Fax: [phone]   RxID:   [id number]    Orders Added: 1)  [facility]- Est Level  3 [02725] 2)  Knee Sleeve- [facility] [L1825]
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
## 2010-02-18 NOTE — Progress Notes (Signed)
Summary: meds prob  Phone Note Call from Patient Call back at Home Phone [phone]   Caller: Patient Summary of Call: pt wants to know if she should continue to take simvastatin? Initial call taken by: De Nurse,  April 09, 2009 2:57 PM  Follow-up for Phone Call        states she cannot afford the $18 for the 40mg  of simvastatin. wants it changed back to 20mg  as this is $4. uses [facility] [address]. told her I will call her when I hear back from md Follow-up by: Golden Circle RN,  April 09, 2009 3:06 PM  Additional Follow-up for Phone Call Additional follow up Details #1::        has been taking 1/2 of 40 mg tablet. Will change back to 20 mg. please call pt to advise. thanks.  Additional Follow-up by: Myrtie Soman  MD,  April 10, 2009 8:38 AM    Additional Follow-up for Phone Call Additional follow up Details #2::    lm that her request has been done Follow-up by: Golden Circle RN,  April 10, 2009 9:39 AM  New/Updated Medications: SIMVASTATIN 20 MG TABS (SIMVASTATIN) one by mouth daily for cholesterol Prescriptions: SIMVASTATIN 20 MG TABS (SIMVASTATIN) one by mouth daily for cholesterol  #30 x 3   Entered and Authorized by:   Myrtie Soman  MD   Signed by:   Myrtie Soman  MD on 04/10/2009   Method used:   Electronically to        [facility] [phone]* (retail)       [address]       [place], [place]  [address]       Ph: [phone]       Fax: [phone]   RxID:   [id number]

## 2010-02-18 NOTE — Letter (Signed)
Summary: Generic Letter  [facility]  [address]   [place], [place] [address]   Phone: [phone]  Fax: [phone]    12/03/2009  TIFFANNI SCARFO [address] [place], [place]  [address]  Dear Ms. Penninger,  Hello Ms. Dragovich. I hope you are doing well. I just wanted to let you know that the results of your Pap smear were normal. Your next Pap will be in one year.     Sincerely,   Priscella Mann MD  Appended Document: Generic Letter letter mailed

## 2010-02-18 NOTE — Assessment & Plan Note (Signed)
Summary: f/u,df   Vital Signs:  Patient profile:   50 year old female Height:      63 inches Weight:      196.2 pounds BMI:     34.88 Temp:     98.6 degrees F oral Pulse rate:   69 / minute BP sitting:   108 / 74  (left arm) Cuff size:   regular  Vitals Entered By: Garen Grams LPN (April 07, 2009 10:16 AM) CC: check up Is Patient Diabetic? No Pain Assessment Patient in pain? no        Primary Care Provider:  Myrtie Soman  MD  CC:  check up.  History of Present Illness: 1. Hyperlipidemia taking medications: takes 1/2 of simva 40 mg due to dizziness and weakness problems with medications: doing okay on 20 mg tablet   ROS: RUQ pain: no   muslce/joint aches: occasional leg aching at night  2. Hypertension adherent to medications: yes side effects from medications:no subjective: pt states that this issue is stable; BP stable today  ROS: chest pain:  no    SOB: no   HA: no     swelling:no   vision changes: no      Habits & Providers  Alcohol-Tobacco-Diet     Tobacco Status: never  Current Medications (verified): 1)  Lisinopril-Hydrochlorothiazide 10-12.5 Mg Tabs (Lisinopril-Hydrochlorothiazide) .... One By Mouth Daily 2)  Paroxetine Hcl 20 Mg  Tabs (Paroxetine Hcl) .... One By Mouth Daily 3)  Ferro-Bob 325 (65 Fe) Mg  Tabs (Ferrous Sulfate) .Marland Kitchen.. 1 Tab By Mouth Daily 4)  Loestrin 1/20 (21) 1-20 Mg-Mcg  Tabs (Norethindrone Acet-Ethinyl Est) .Marland Kitchen.. 1 Tablet By Mouth Daily As Directed - 28 Pill Pack 5)  Simvastatin 40 Mg Tabs (Simvastatin) .... One By Mouth Daily For Cholesterol  Allergies (verified): No Known Drug Allergies  Review of Systems       review of systems as noted in HPI section   Physical Exam  Additional Exam:  General:  Vital signs reviewed -- obese but otherwise normal Alert, appropriate; well-dressed and well-nourished Lungs:  work of breathing unlabored, clear to auscultation bilaterally; no wheezes, rales, or ronchi; good air movement  throughout Heart:  regular rate and rhythm, no murmurs; normal s1/s2 Pulses:  DP and radial pulses 2+ bilaterally  Extremities:  no cyanosis, clubbing, or edema. No calf tenderness to palpation Neurologic:  alert and oriented. speech normal.    Impression & Recommendations:  Problem # 1:  HYPERLIPIDEMIA, MILD (ICD-272.4) Assessment Unchanged  taking 1/2 tab of simva 40 mg. Will recheck LDL today as it has been > 3 months. If normal continue simva 20. If above goal, consider switch to lipitor. Check LFTs .  Her updated medication list for this problem includes:    Simvastatin 40 Mg Tabs (Simvastatin) ..... One by mouth daily for cholesterol  Orders: Comp Met-[facility] [phone]) Direct LDL-[facility] [phone]) [facility]- Est Level  3 ([address])  Labs Reviewed: SGOT: 12 (12/30/2008)   SGPT: <8 U/L (12/30/2008)  Prior 10 Yr Risk Heart Disease: 6 % (02/13/2007)   HDL:41 (12/30/2008), 46 (07/18/2007)  LDL:127 (12/30/2008), 138 (07/18/2007)  Chol:176 (12/30/2008), 193 (07/18/2007)  Trig:42 (12/30/2008), 47 (07/18/2007)  Problem # 2:  HYPERTENSION, BENIGN SYSTEMIC (ICD-401.1) Assessment: Improved  stable. continue current regimen.   Meds: Lisinopril-hydrochlorothiazide) ..... One by mouth daily  BP today: 108/74 Prior BP: 148/90 (11/18/2008)  Prior 10 Yr Risk Heart Disease: 6 % (02/13/2007)  Labs Reviewed: K+: 4.3 (12/30/2008) Creat: : 0.63 (12/30/2008)   Chol:  176 (12/30/2008)   HDL: 41 (12/30/2008)   LDL: 127 (12/30/2008)   TG: 42 (12/30/2008)  Orders: [facility]- Est Level  3 ([address])  Complete Medication List: 1)  Lisinopril-hydrochlorothiazide 10-12.5 Mg Tabs (Lisinopril-hydrochlorothiazide) .... One by mouth daily 2)  Paroxetine Hcl 20 Mg Tabs (Paroxetine hcl) .... One by mouth daily 3)  Ferro-bob 325 (65 Fe) Mg Tabs (Ferrous sulfate) .Marland Kitchen.. 1 tab by mouth daily 4)  Loestrin 1/20 (21) 1-20 Mg-mcg Tabs (Norethindrone acet-ethinyl est) .Marland Kitchen.. 1 tablet by mouth daily as directed - 28 pill  pack 5)  Simvastatin 40 Mg Tabs (Simvastatin) .... One by mouth daily for cholesterol  Other Orders: CBC-[facility] ([address]454)  Patient Instructions: 1)  we'll let you know about the blood work 2)  follow-up with me in 2 months 3)  keep up the walking -- try to increase it to 4 days per week   Prevention & Chronic Care Immunizations   Influenza vaccine: Fluvax Non-MCR  (11/18/2008)   Influenza vaccine due: 10/14/2008    Tetanus booster: 03/22/2003: Done.   Tetanus booster due: 03/21/2013    Pneumococcal vaccine: Not documented  Other Screening   Pap smear: NEGATIVE FOR INTRAEPITHELIAL LESIONS OR MALIGNANCY.  (11/18/2008)   Pap smear due: 11/19/2010    Mammogram: BI-RADS CATEGORY 2:  Benign finding(s).^MM DIGITAL DIAGNOSTIC BILAT  (11/18/2008)   Mammogram due: 11/18/2009   Smoking status: never  (04/07/2009)  Lipids   Total Cholesterol: 176  (12/30/2008)   Lipid panel action/deferral: LDL Direct Ordered   LDL: 098  (12/30/2008)   LDL Direct: 113  (09/01/2008)   HDL: 41  (12/30/2008)   Triglycerides: 42  (12/30/2008)    SGOT (AST): 12  (12/30/2008)   SGPT (ALT): <8 U/L  (12/30/2008) CMP ordered    Alkaline phosphatase: 72  (12/30/2008)   Total bilirubin: 0.3  (12/30/2008)    Lipid flowsheet reviewed?: Yes   Progress toward LDL goal: Unchanged  Hypertension   Last Blood Pressure: 108 / 74  (04/07/2009)   Serum creatinine: 0.63  (12/30/2008)   BMP action: Ordered   Serum potassium 4.3  (12/30/2008) CMP ordered     Hypertension flowsheet reviewed?: Yes   Progress toward BP goal: At goal  Self-Management Support :   Personal Goals (by the next clinic visit) :      Personal blood pressure goal: 140/90  (09/01/2008)   Hypertension self-management support: Written self-care plan  (04/07/2009)   Hypertension self-care plan printed.    Lipid self-management support: Written self-care plan  (04/07/2009)   Lipid self-care plan printed.

## 2010-02-18 NOTE — Letter (Signed)
Summary: Generic Letter  [facility]  [address]   [place], [place] [address]   Phone: [phone]  Fax: [phone]    12/03/2009  RHIANON ZABAWA [address] [place], [place]  [address]  Dear Ms. Stanis,  Hello Ms. Rampy. I hope you are doing well. I just wanted to let you know that the results of your Pap smear were normal. Please continue to measure your blood pressures and notify the office if they get low.  It was nice to work with you Ms. Fitzsimons.     Sincerely,   Priscella Mann MD  Appended Document: Generic Letter letter mailed

## 2010-02-18 NOTE — Assessment & Plan Note (Signed)
Summary: fluid on knee,tcb   Vital Signs:  Patient profile:   50 year old female Weight:      200.4 pounds Temp:     98 degrees F oral Pulse rate:   61 / minute BP sitting:   149 / 96  (left arm)  Vitals Entered By: Loralee Pacas CMA (April 13, 2009 10:49 AM)  Primary Care Provider:  Myrtie Soman  MD   History of Present Illness: 1. L knee pain Noticed yesterday am, when she got out of bed that it hurt to walk. No injury, no trauma, no unusual physical activity or exericse. Has noticed that it was swollen yesterday, put frozen peas on it which helped. Swelling present but much better today. Hurts today as well, but not as bad as yesterday. Stepping down and stepping up hurts. No locking or giving way.   Has taken tylenol which did not help.   PMHx: has "flare-ups" from time to time on this knee. Reports a history of torn cartilage found years ago on xrays.   echart shows R knee films with evidence of OA. No images of the L knee on file.   Current Medications (verified): 1)  Lisinopril-Hydrochlorothiazide 10-12.5 Mg Tabs (Lisinopril-Hydrochlorothiazide) .... One By Mouth Daily 2)  Paroxetine Hcl 20 Mg  Tabs (Paroxetine Hcl) .... One By Mouth Daily 3)  Ferro-Bob 325 (65 Fe) Mg  Tabs (Ferrous Sulfate) .Marland Kitchen.. 1 Tab By Mouth Daily 4)  Loestrin 1/20 (21) 1-20 Mg-Mcg  Tabs (Norethindrone Acet-Ethinyl Est) .Marland Kitchen.. 1 Tablet By Mouth Daily As Directed - 28 Pill Pack 5)  Simvastatin 20 Mg Tabs (Simvastatin) .... One By Mouth Daily For Cholesterol  Allergies (verified): No Known Drug Allergies  Physical Exam  General:  vitals signs reviewed -- hypertensive, obese,  but otherwise normal  Msk:  L Knee: Normal to inspection with no erythema or effusion or obvious bony abnormalities. Palpation normal with no warmth or joint line tenderness or patellar tenderness or condyle tenderness. ROM normal in flexion. Lacks about 5-10 degrees of full extension. Pain at maximal flexion Ligaments with  solid consistent endpoints including ACL, PCL, LCL, MCL. Medial pain with varus stress on the knee. Positive McMurray's.  Non painful patellar compression. Patellar and quadriceps tendons unremarkable.  Pt cannot complete pivot shift testing due to pain.  Additional Exam:  Patient given informed consent for injection. Appropriate verbal time out taken. Area cleaned and prepped in usual sterile fashion. 1 cc kennalog plus 9-cc 1% lidocaine without epinephrine was injected into the L knee. Patient tolerated procedure well without any apparent complications.    Impression & Recommendations:  Problem # 1:  KNEE PAIN, LEFT (ICD-719.46)  fairly acute but reports problems with this in the past. Likely OA with h/o of this in the R knee. + McMurray's suggests possible meniscus pathology as well. Consider MRI if no improvement, but would likely start with plain films. Injection today. NSAID + ice. Return parameters discussed.  Patient agreeable. See instructions.  Her updated medication list for this problem includes:    Diclofenac Sodium 75 Mg Tbec (Diclofenac sodium) ..... One by mouth two times a day for knee pain  Orders: [facility]- Est Level  3 ([address]) Injection, large joint- [facility] ([address])  Complete Medication List: 1)  Lisinopril-hydrochlorothiazide 10-12.5 Mg Tabs (Lisinopril-hydrochlorothiazide) .... One by mouth daily 2)  Paroxetine Hcl 20 Mg Tabs (Paroxetine hcl) .... One by mouth daily 3)  Ferro-bob 325 (65 Fe) Mg Tabs (Ferrous sulfate) .Marland Kitchen.. 1 tab by mouth daily  4)  Loestrin 1/20 (21) 1-20 Mg-mcg Tabs (Norethindrone acet-ethinyl est) .Marland Kitchen.. 1 tablet by mouth daily as directed - 28 pill pack 5)  Simvastatin 20 Mg Tabs (Simvastatin) .... One by mouth daily for cholesterol 6)  Diclofenac Sodium 75 Mg Tbec (Diclofenac sodium) .... One by mouth two times a day for knee pain  Patient Instructions: 1)  take the diclofenac  two times a day. Take it with food. 2)  ice the knee 2-3 times a day for 20  minutes.  3)  follow-up if this gets worse in the next month or two. We may need to take a look at the cartilage.  Prescriptions: DICLOFENAC SODIUM 75 MG TBEC (DICLOFENAC SODIUM) one by mouth two times a day for knee pain  #30 x 0   Entered and Authorized by:   Myrtie Soman  MD   Signed by:   Myrtie Soman  MD on 04/13/2009   Method used:   Electronically to        [facility] [phone]* (retail)       [address]       [place], [place]  [address]       Ph: [phone]       Fax: [phone]   RxID:   [id number]
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
## 2010-02-18 NOTE — Miscellaneous (Signed)
Summary: Problem list   

## 2010-03-02 ENCOUNTER — Encounter: Payer: Self-pay | Admitting: *Deleted

## 2010-03-03 ENCOUNTER — Ambulatory Visit: Payer: Self-pay | Admitting: Family Medicine

## 2010-03-05 ENCOUNTER — Inpatient Hospital Stay (INDEPENDENT_AMBULATORY_CARE_PROVIDER_SITE_OTHER)
Admission: RE | Admit: 2010-03-05 | Discharge: 2010-03-05 | Disposition: A | Discharge status: Home / Self Care | Payer: Self-pay | Source: Ambulatory Visit | Attending: Family Medicine | Admitting: Family Medicine

## 2010-03-05 DIAGNOSIS — L03119 Cellulitis of unspecified part of limb: Secondary | ICD-10-CM

## 2010-03-05 DIAGNOSIS — L02419 Cutaneous abscess of limb, unspecified: Secondary | ICD-10-CM

## 2010-03-05 LAB — GLUCOSE, CAPILLARY: Glucose-Capillary: 112 mg/dL — ABNORMAL HIGH (ref 70–99)

## 2010-03-22 ENCOUNTER — Ambulatory Visit (INDEPENDENT_AMBULATORY_CARE_PROVIDER_SITE_OTHER): Payer: Self-pay | Admitting: Family Medicine

## 2010-03-22 ENCOUNTER — Encounter: Payer: Self-pay | Admitting: Family Medicine

## 2010-03-22 VITALS — BP 132/90 | HR 60 | Temp 98.5°F | Ht 63.0 in | Wt 198.0 lb

## 2010-03-22 DIAGNOSIS — N938 Other specified abnormal uterine and vaginal bleeding: Secondary | ICD-10-CM

## 2010-03-22 DIAGNOSIS — F339 Major depressive disorder, recurrent, unspecified: Secondary | ICD-10-CM

## 2010-03-22 DIAGNOSIS — I10 Essential (primary) hypertension: Secondary | ICD-10-CM

## 2010-03-22 DIAGNOSIS — E669 Obesity, unspecified: Secondary | ICD-10-CM

## 2010-03-22 DIAGNOSIS — E785 Hyperlipidemia, unspecified: Secondary | ICD-10-CM

## 2010-03-22 DIAGNOSIS — N949 Unspecified condition associated with female genital organs and menstrual cycle: Secondary | ICD-10-CM

## 2010-03-22 DIAGNOSIS — E079 Disorder of thyroid, unspecified: Secondary | ICD-10-CM

## 2010-03-22 LAB — COMPREHENSIVE METABOLIC PANEL
ALT: 8 U/L (ref 0–35)
AST: 13 U/L (ref 0–37)
Alkaline Phosphatase: 59 U/L (ref 39–117)
BUN: 13 mg/dL (ref 6–23)
Calcium: 9.3 mg/dL (ref 8.4–10.5)
Chloride: 101 mEq/L (ref 96–112)
Creat: 0.8 mg/dL (ref 0.40–1.20)
Potassium: 4.3 mEq/L (ref 3.5–5.3)

## 2010-03-22 LAB — CONVERTED CEMR LAB
ALT: 8 units/L (ref 0–35)
AST: 13 units/L (ref 0–37)
Alkaline Phosphatase: 59 units/L (ref 39–117)
Calcium: 9.3 mg/dL (ref 8.4–10.5)
Chloride: 101 meq/L (ref 96–112)
Creatinine, Ser: 0.8 mg/dL (ref 0.40–1.20)
Direct LDL: 125 mg/dL — ABNORMAL HIGH
HCT: 37.3 % (ref 36.0–46.0)
MCHC: 32.4 g/dL (ref 30.0–36.0)
Platelets: 303 10*3/uL (ref 150–400)
Potassium: 4.3 meq/L (ref 3.5–5.3)
RDW: 14.6 % (ref 11.5–15.5)

## 2010-03-22 LAB — CBC
HCT: 37.3 % (ref 36.0–46.0)
Hemoglobin: 12.1 g/dL (ref 12.0–15.0)
MCH: 27.6 pg (ref 26.0–34.0)
MCHC: 32.4 g/dL (ref 30.0–36.0)
RDW: 14.6 % (ref 11.5–15.5)

## 2010-03-22 LAB — TSH: TSH: 0.297 u[IU]/mL — ABNORMAL LOW (ref 0.350–4.500)

## 2010-03-22 NOTE — Assessment & Plan Note (Addendum)
Prolonged period of 3 weeks with mild-moderate bleeding. First time in a while this has happened. Previous work-up with endometrial biopsy negative.  Will monitor for now and follow-up in 1 month.  Hgb/Hct low end of normal. Will encourage patient to take multivitamin with iron or iron.

## 2010-03-22 NOTE — Patient Instructions (Addendum)
I will call you regarding the results of your cholesterol test and you thyroid tests. Please continue to take your blood pressure medications, 2 tablets a day. Regarding the spider bite, finish the course of antibiotics. Use an antibiotic ointment on the wound and cover with gauze or some other bandaid to keep the area clean. Change dressings about once a day.  If you have fevers (temperature greater than 101), notice the wound getting bigger, or the skin around the would getting red, please call the clinic and let me know or go to Urgent Care or the Emergency Room.  Regarding the bleeding, please let me know if it continues. I will check your blood count today and let you know the results of that. If it is low, then I may start you on iron.  Please f/u with me in 1 month.

## 2010-03-22 NOTE — Progress Notes (Signed)
  Subjective:    Patient ID: Amber Willis, female    DOB: 1960/02/16, 50 y.o.   MRN: [id number]  HPI 1. Spider bite Went to [facility] 03/05/2010. Given 10d course of Clindamycin.  Patient feels bite site is improving.  ROS: subjective fevers (last one on 03/03); denies nausea/vomiting, chills  2. Depression Does not feel depressed. Usually presents as being quiet.  Compliant with Paxil.  ROS: denies SI/HI  3. HLD Compliant with simvastatin. ROS: denies RUQ pain, myalgias  4. Dysfx uterine bleeding h/o this in the past. Endometrial bx neg at that time. Now, bleeding past 3 weeks, using 2 pads/day.  ROS: denies discharge, dysuria   Review of Systems     Objective:   Physical Exam  Constitutional: She appears well-developed.       Pleasant  Neck: Neck supple. No tracheal deviation present. No thyromegaly present.  Cardiovascular: Normal rate, regular rhythm and normal heart sounds.   No murmur heard. Pulmonary/Chest: Effort normal and breath sounds normal.  Musculoskeletal: She exhibits no edema.  Skin:       3x3cm open wound lateral left calf, serous drainage but no pus, no surrounding erythema, no induration/fluctuance  Psychiatric: Her speech is normal and behavior is normal. Judgment normal. Her mood appears not anxious. Her affect is not blunt and not labile. She is not slowed and not withdrawn. Cognition and memory are normal. She does not exhibit a depressed mood. She expresses no homicidal and no suicidal ideation. She expresses no suicidal plans and no homicidal plans. She is attentive.          Assessment & Plan:

## 2010-03-22 NOTE — Assessment & Plan Note (Addendum)
LDL good last year but higher today. Encouraged lifestyle modification (diet and exercise). Will increase statin. LFTs good.

## 2010-03-22 NOTE — Assessment & Plan Note (Addendum)
No changes to medication at this visit. Continue lisinopril/hctz 20/25 daily. Would tolerate current blood pressures in asymptomatic and non-diabetic patient. Or may consider adding Norvasc if BP worsens. Will f/u in 1 month.

## 2010-03-22 NOTE — Assessment & Plan Note (Signed)
Not motivated to lose weight/exercise. Discussed 3# weight gain since last visit and her being overweight. Encouraged her to think about making healthy lifestyle choices and about exercise and eating healthy. Patient does not seem interested however.

## 2010-03-22 NOTE — Assessment & Plan Note (Addendum)
Stable. Will continue current Paxil. Will check PH9Q at next visit to monitor.

## 2010-03-23 ENCOUNTER — Other Ambulatory Visit: Payer: Self-pay | Admitting: Family Medicine

## 2010-03-23 ENCOUNTER — Telehealth: Payer: Self-pay | Admitting: Family Medicine

## 2010-03-23 DIAGNOSIS — E785 Hyperlipidemia, unspecified: Secondary | ICD-10-CM

## 2010-03-23 LAB — LDL CHOLESTEROL, DIRECT: Direct LDL: 125 mg/dL — ABNORMAL HIGH

## 2010-03-23 MED ORDER — PRAVASTATIN SODIUM 80 MG PO TABS: 80.0000 mg | ORAL_TABLET | Freq: Every evening | ORAL | Status: DC

## 2010-03-23 MED ORDER — SIMVASTATIN 20 MG PO TABS: 40.0000 mg | ORAL_TABLET | Freq: Every day | ORAL | Status: DC

## 2010-03-23 NOTE — Telephone Encounter (Signed)
States that she cannot afford Simvastatin and needs something cheaper [facility] [address]

## 2010-03-23 NOTE — Telephone Encounter (Signed)
Left message on voice mail. 1. Informed LDL was elevated. Rx for increased simvastatin sent to pharmacy. 2. Not anemic but encouraged MV.

## 2010-03-23 NOTE — Assessment & Plan Note (Signed)
TSH continues to be low for patient but normal T4. Will not check any further if no worrisome symptoms.

## 2010-03-23 NOTE — Telephone Encounter (Signed)
Will forward message to PCP.

## 2010-03-23 NOTE — Telephone Encounter (Signed)
Please inform patient that Rx for pravastatin, which is on $4 list @ [facility], has been sent to her pharmacy.

## 2010-03-29 ENCOUNTER — Telehealth: Payer: Self-pay | Admitting: Family Medicine

## 2010-03-29 NOTE — Telephone Encounter (Signed)
Pt went to pharmacy to pick up cholesterol medicine and was told her dosage had been changed to twice daily for 54m  Supply.  Cannot afford 8.00.  Need to change back to once daily for 30 day supply/[facility]c

## 2010-03-31 ENCOUNTER — Other Ambulatory Visit: Payer: Self-pay | Admitting: Family Medicine

## 2010-03-31 DIAGNOSIS — E785 Hyperlipidemia, unspecified: Secondary | ICD-10-CM

## 2010-03-31 MED ORDER — SIMVASTATIN 40 MG PO TABS: 40.0000 mg | ORAL_TABLET | Freq: Every day | ORAL | Status: DC

## 2010-03-31 NOTE — Telephone Encounter (Signed)
Called pharmacy. Pravastatin 80 costs $50 monthly versus simvastatin 40 $20. Rx phoned in for simvastatin.

## 2010-03-31 NOTE — Assessment & Plan Note (Signed)
Patient could not afford pravastatin 80, which would have been the next dose increase equivalent. This costs $50 a month versus $20. Will change back to simvastatin 40.

## 2010-04-06 ENCOUNTER — Telehealth: Payer: Self-pay | Admitting: Family Medicine

## 2010-04-06 NOTE — Telephone Encounter (Signed)
States that Simvastatin is too expensive - wants to know if she can call something in on the $4 plan.

## 2010-04-06 NOTE — Telephone Encounter (Signed)
Will forward message to MD.

## 2010-04-07 ENCOUNTER — Other Ambulatory Visit: Payer: Self-pay | Admitting: Family Medicine

## 2010-04-07 DIAGNOSIS — E785 Hyperlipidemia, unspecified: Secondary | ICD-10-CM

## 2010-04-07 MED ORDER — SIMVASTATIN 40 MG PO TABS: 40.0000 mg | ORAL_TABLET | Freq: Every day | ORAL | Status: DC

## 2010-04-07 NOTE — Telephone Encounter (Signed)
Left message informing patient simvastatin was cheaper ($15 for 90 tablets) @ [facility] or [facility]. If she wants to pick one of these pharmacies, asked her to call clinic and let us know which one and where and can send new Rx.

## 2010-04-07 NOTE — Assessment & Plan Note (Signed)
New Rx sent to different pharmacy [facility]) where she can get month of meds for about $10.

## 2010-05-11 ENCOUNTER — Encounter: Payer: Self-pay | Admitting: Family Medicine

## 2010-05-14 ENCOUNTER — Ambulatory Visit: Payer: Self-pay | Admitting: Family Medicine
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
## 2010-06-03 ENCOUNTER — Ambulatory Visit (INDEPENDENT_AMBULATORY_CARE_PROVIDER_SITE_OTHER): Payer: Self-pay | Admitting: Family Medicine

## 2010-06-03 ENCOUNTER — Encounter: Payer: Self-pay | Admitting: Family Medicine

## 2010-06-03 VITALS — BP 104/70 | HR 70 | Temp 98.2°F | Wt 190.0 lb

## 2010-06-03 DIAGNOSIS — M7520 Bicipital tendinitis, unspecified shoulder: Secondary | ICD-10-CM

## 2010-06-03 MED ORDER — DICLOFENAC SODIUM 75 MG PO TBEC: 75.0000 mg | DELAYED_RELEASE_TABLET | Freq: Two times a day (BID) | ORAL | Status: DC

## 2010-06-03 NOTE — Assessment & Plan Note (Signed)
2 days of right upper arm pain localizing to insertion of biceps tendon and radiating down bicep.  No increase in activity that she remembers.  No acute injury.  No loss of strength, but ROM limited by pain.    Biceps tendonitis, grade one.  Will give 10 days of voltaren, and gave stretches and told to ice.  RTC if worse, no better in 2 weeks.  Would consider PT as next step.

## 2010-06-03 NOTE — Patient Instructions (Addendum)
Please come back in 2--3 weeks if you are worse or no better.    Biceps Tendon Tendinitis (Proximal) and Tenosynovitis with Rehab   Tendonitis and tenosynovitis involve inflammation of the tendon and the tendon lining (sheath). The Proximal biceps tendon is vulnerable to tendonitis and tenosynovitis, which causes pain and discomfort in the front of the shoulder and upper arm. The tendon lining secretes a fluid that helps lubricate the tendon, allowing for proper function without pain. When the tendon and its lining become inflamed, the tendon can no longer glide smoothly, causing pain. The proximal biceps tendon connects the biceps muscle to two bones of the shoulder. It is important for proper function of the elbow and turning the palm upward (supination) using the wrist.   Proximal biceps tendon tendinitis may include a grade 1 or 2 strain of the tendon. Grade 1 strains involve a slight pull of the tendon without signs of tearing and no observed tendon lengthening. There is also no loss of strength. Grade 2 strains involve small tears in the tendon fibers. The tendon or muscle is stretched and strength is usually decreased.     SYMPTOMS  Pain, tenderness, swelling, warmth, or redness over the front of the shoulder.  Pain that gets worse with shoulder and elbow use, especially against resistance.  Limited motion of the shoulder or elbow.  Crackling sound (crepitation) when the tendon or shoulder is moved or touched.   CAUSES   The symptoms of biceps tendonitis are due to inflammation of the tendon. Inflammation may be caused by:  Strain from sudden increase in amount or intensity of activity.  Direct blow or injury to the elbow (uncommon).  Overuse or repetitive elbow bending or wrist rotation, particularly when turning the palm up, or with elbow hyperextension.   RISK INCREASES WITH    Sports that involve contact or overhead arm activity (throwing sports, gymnastics, weightlifting,  bodybuilding, rock climbing).  Heavy labor.  Poor strength and flexibility.  Failure to warm up properly before activity.   PREVENTIVE MEASURES    Warm up and stretch properly before activity.  Allow time for recovery between activities.  Maintain physical fitness: l Strength, flexibility, and endurance. l Cardiovascular fitness.  Learn and use proper exercise technique.   PROGNOSIS With proper treatment, proximal biceps tendon tendonitis and tenosynovitis is usually curable within 6 weeks. Healing is usually quicker if the cause was a direct blow, not overuse.     POSSIBLE COMPLICATIONS    Longer healing time if not properly treated or if not given enough time to heal.  Chronically inflamed tendon that causes persistent pain with activity, that may progress to constant pain and potentially rupture of the tendon.  Recurring symptoms, especially if activity is resumed too soon or with overuse, a direct blow, or use of poor exercise technique.   GENERAL TREATMENT CONSIDERATIONS Treatment first involves ice and medicine, to reduce pain and inflammation. It is helpful to modify activities that cause pain, to reduce the chances of causing the condition to get worse. Strengthening and stretching exercises should be performed to promote proper use of the muscles of the shoulder. These exercises may be performed at home or with a therapist. Other treatments may be given such as ultrasound or heat therapy. A corticosteroid injection may be recommended to help reduce inflammation of the tendon lining. Surgery is usually not necessary. Sometimes, if symptoms last for greater than 6 months, surgery will be advised to detach the tendon and re-insert it into  the arm bone. Surgery to correct other shoulder problems that may be contributing to tendinitis may be advised before surgery for the tendinitis itself.     MEDICATION:    If pain medicine is needed, nonsteroidal anti-inflammatory medicines  (aspirin and ibuprofen), or other minor pain relievers (acetaminophen), are often advised.    Do not take pain medicine for 7 days before surgery.    Prescription pain relievers may be given if your caregiver thinks they are needed. Use only as directed and only as much as you need.  Corticosteroid injections may be given. These injections should only be used on the most severe cases, as one can only receive a limited number of them.   HEAT AND COLD:    Cold treatment (icing) should be applied for 10 to 15 minutes every 2 to 3 hours for inflammation and pain, and immediately after activity that aggravates your symptoms. Use ice packs or an ice massage.  Heat treatment may be used before performing stretching and strengthening activities prescribed by your caregiver, physical therapist, or athletic trainer. Use a heat pack or a warm water soak.     SEEK MEDICAL CARE IF:    Symptoms get worse or do not improve in 2 weeks, despite treatment.  New, unexplained symptoms develop. (Drugs used in treatment may produce side effects.)     EXERCISES   RANGE OF MOTION AND EXERCISES - Biceps Tendon (Proximal) and Tenosynovitis These exercises may help you when beginning to rehabilitate your injury. Your symptoms may go away with or without further involvement from your physician, physical therapist or athletic trainer. While completing these exercises, remember:    Restoring tissue flexibility helps normal motion to return to the joints. This allows healthier, less painful movement and activity.  An effective stretch should be held for at least 30 seconds.  A stretch should never be painful. You should only feel a gentle lengthening or release in the stretched tissue.       STRETCH - Flexion, Standing    Stand with good posture. With an underhand grip on your __________ hand and an overhand grip on the opposite hand, grasp a broomstick or cane so that your hands are a little more than shoulder  width apart.  Keeping your __________ elbow straight and shoulder muscles relaxed, push the stick with your opposite hand to raise your __________ arm in front of your body and then overhead. Raise your arm until you feel a stretch in your __________ shoulder, but before you have increased shoulder pain.  Try to avoid shrugging your __________ shoulder as your arm rises, by keeping your shoulder blade tucked down and toward your mid-back spine. Hold for __________ seconds.  Slowly return to the starting position.   Repeat __________ times. Complete this exercise __________ times per day.       STRETCH - Abduction, Supine    Lie on your back. With an underhand grip on your __________ hand and an overhand grip on the opposite hand, grasp a broomstick or cane so that your hands are a little more than shoulder width apart.  Keeping your __________ elbow straight and shoulder muscles relaxed, push the stick with your opposite hand to raise your __________ arm out to the side of your body and then overhead. Raise your arm until you feel a stretch in your __________ shoulder, but before you have increased shoulder pain.  Try to avoid shrugging your __________ shoulder as your arm rises, by keeping your shoulder blade  tucked down and toward your mid-back spine. Hold for __________ seconds.  Slowly return to the starting position.   Repeat __________ times. Complete this exercise __________ times per day.       ROM - Flexion, Active-Assisted  Lie on your back. You may bend your knees for comfort.  Grasp a broomstick or cane so your hands are about shoulder width apart.  Your __________ hand should grip the end of the stick so that your hand is positioned "thumbs-up," as if you were about to shake hands.  Using your healthy arm to lead, raise your __________ arm overhead until you feel a gentle stretch in your shoulder. Hold for __________ seconds.    Use the stick to assist in returning your  __________ arm to its starting position. Repeat __________ times. Complete this exercise __________ times per day.       STRETCH - Flexion, Standing   Stand facing a wall. Walk your __________ fingers up the wall until you feel a moderate stretch in your shoulder. As your hand gets higher, you may need to step closer to the wall or use a door frame to walk through.  Try to avoid shrugging your __________ shoulder as your arm rises, by keeping your shoulder blade tucked down and toward your mid-back spine.    Hold for __________ seconds. Use your other hand, if needed, to ease out of the stretch and return to the starting position. Repeat __________ times. Complete this exercise __________ times per day.      ROM - Internal Rotation   Using underhand grips, grasp a stick behind your back with both hands.  While standing upright with good posture, slide the stick up your back until you feel a mild stretch in the front of your shoulder.  Hold for __________ seconds. Slowly return to your starting position. Repeat __________ times. Complete this exercise __________ times per day.       STRETCH - Internal Rotation    Place your __________ hand behind your back, palm-up.  Throw a towel or belt over your opposite shoulder. Grasp the towel with your __________ hand.  While keeping an upright posture, gently pull up on the towel until you feel a stretch in the front of your __________ shoulder.  Avoid shrugging your __________ shoulder as your arm rises, by keeping your shoulder blade tucked down and toward your mid-back spine.    Hold for __________ seconds. Release the stretch by lowering your opposite hand.   Repeat __________ times. Complete this exercise __________ times per day.       STRENGTHENING EXERCISES - Biceps Tendon Tendinitis (Proximal) and Tenosynovitis These exercises may help you regain your strength after your physician has discontinued your restraint in a cast or  brace. They may resolve your symptoms with or without further involvement from your physician, physical therapist or athletic trainer. While completing these exercises, remember:    Muscles can gain both the endurance and the strength needed for everyday activities through controlled exercises.  Complete these exercises as instructed by your physician, physical therapist or athletic trainer. Increase the resistance and repetitions only as guided.  You may experience muscle soreness or fatigue, but the pain or discomfort you are trying to eliminate should never worsen during these exercises. If this pain does get worse, stop and make sure you are following the directions exactly. If the pain is still present after adjustments, discontinue the exercise until you can discuss the trouble with your caregiver.  STRENGTH - Elbow Flexors, Isometric  Stand or sit upright on a firm surface.  Place your __________ arm so that your hand is palm-up and at the height of your waist.  Place your opposite hand on top of your forearm. Gently push down as your __________ arm resists.  Push as hard as you can with both arms, without causing any pain or movement at your __________ elbow. Hold this stationary position for __________ seconds.  Gradually release the tension in both arms. Allow your muscles to relax completely before repeating. Repeat __________ times. Complete this exercise __________ times per day.     STRENGTH - Shoulder Flexion, Isometric    With good posture and facing a wall, stand or sit about 4-6 inches away.  Keeping your __________ elbow straight, gently press the top of your fist into the wall.  Increase the pressure gradually until you are pressing as hard as you can, without shrugging your shoulder or increasing any shoulder discomfort.  Hold for __________ seconds.  Release the tension slowly. Relax your shoulder muscles completely before you start the next repetition.   Repeat  __________ times. Complete this exercise __________ times per day.       STRENGTH - Elbow Flexors, Supinated  With good posture, stand or sit on a firm chair without armrests. Allow your __________ arm to rest at your side with your palm facing forward.    Holding a __________ weight, or gripping a rubber exercise band or tubing,  bring your hand toward your shoulder.  Allow your muscles to control the resistance as your hand returns to your side.   Repeat __________ times. Complete this exercise __________ times per day.       STRENGTH - Shoulder Flexion    Stand or sit with good posture. Grasp a __________ weight, or an exercise band or tubing, so that your hand is "thumbs-up," like when you shake hands.  Slowly lift your __________ arm as far as you can, without increasing any shoulder pain. At first, many people can only raise their hand to shoulder height.  Avoid shrugging your __________ shoulder as your arm rises, by keeping your shoulder blade tucked down and toward your mid-back spine.    Hold for __________ seconds. Control the descent of your hand as you slowly return to your starting position.   Repeat __________ times. Complete this exercise __________ times per day.     Document Released: 01/03/2005  Document Re-Released: 01/25/2009 [facility] Patient Information 2011 [place], [place].

## 2010-06-03 NOTE — Progress Notes (Signed)
  Subjective:    Patient ID: Amber Willis, female    DOB: 09/10/1960, 50 y.o.   MRN: [id number]  HPI  2 days of right upper arm pain localizing to insertion of biceps tendon and radiating down bicep.  No increase in activity that she remembers.  No acute injury.  No loss of strength, but ROM limited by pain.       Review of Systems     Objective:   Physical Exam    Vital signs reviewed General appearance - alert, well appearing, and in no distress and oriented to person, place, and time msk- upper extremities with full ROM bilaterally.  Full strength.  TTP at insertion of biceps tendon proximally and long biceps tendon.  No bulge.  Positive yergesons    Assessment & Plan:  Biceps tendonitis, grade one.  Will give 10 days of voltaren, and gave stretches and told to ice.  RTC if worse, no better in 2 weeks.  Would consider PT as next step.

## 2010-07-12 ENCOUNTER — Encounter: Payer: Self-pay | Admitting: Family Medicine

## 2010-07-12 ENCOUNTER — Ambulatory Visit (INDEPENDENT_AMBULATORY_CARE_PROVIDER_SITE_OTHER): Payer: Self-pay | Admitting: Family Medicine

## 2010-07-12 DIAGNOSIS — E669 Obesity, unspecified: Secondary | ICD-10-CM

## 2010-07-12 DIAGNOSIS — F339 Major depressive disorder, recurrent, unspecified: Secondary | ICD-10-CM

## 2010-07-12 DIAGNOSIS — N949 Unspecified condition associated with female genital organs and menstrual cycle: Secondary | ICD-10-CM

## 2010-07-12 DIAGNOSIS — N951 Menopausal and female climacteric states: Secondary | ICD-10-CM

## 2010-07-12 DIAGNOSIS — N938 Other specified abnormal uterine and vaginal bleeding: Secondary | ICD-10-CM

## 2010-07-12 DIAGNOSIS — I10 Essential (primary) hypertension: Secondary | ICD-10-CM

## 2010-07-12 DIAGNOSIS — R928 Other abnormal and inconclusive findings on diagnostic imaging of breast: Secondary | ICD-10-CM

## 2010-07-12 DIAGNOSIS — E785 Hyperlipidemia, unspecified: Secondary | ICD-10-CM

## 2010-07-12 MED ORDER — LISINOPRIL-HYDROCHLOROTHIAZIDE 10-12.5 MG PO TABS: 2.0000 | ORAL_TABLET | Freq: Every day | ORAL | Status: DC

## 2010-07-12 MED ORDER — PAROXETINE HCL 20 MG PO TABS: 20.0000 mg | ORAL_TABLET | Freq: Every day | ORAL | Status: DC

## 2010-07-12 MED ORDER — OMEPRAZOLE 20 MG PO CPDR: 20.0000 mg | DELAYED_RELEASE_CAPSULE | Freq: Every day | ORAL | Status: DC

## 2010-07-12 MED ORDER — SIMVASTATIN 40 MG PO TABS: 40.0000 mg | ORAL_TABLET | Freq: Every day | ORAL | Status: DC

## 2010-07-12 NOTE — Patient Instructions (Signed)
Your blood pressure is good. Continue your medication.   Continue to exercise and eat well.  If we could work on losing about 15 pounds, I think we could cut back on your blood pressure medicine.   See me again in about 6 months (December). Come in 1 week early for lab visit, so we can go over your labs at that time.   About the boil, if you start getting fevers (temperature 100.4 or greater), worsening pain, or it starts looking red and infected, make an appointment to come into the clinic.

## 2010-07-12 NOTE — Assessment & Plan Note (Signed)
See dysfunctional uterine bleeding A/P.

## 2010-07-12 NOTE — Assessment & Plan Note (Addendum)
Good control on current regiment. Patient asking to go down on medications. Will continue current regimen for now. Encouraged 10% weight loss as possible means to decreased medications. Will re-assess at next visit in 6 months.

## 2010-07-12 NOTE — Assessment & Plan Note (Signed)
Documentation only. Next mammogram due end of this year. Will follow-up.

## 2010-07-12 NOTE — Progress Notes (Signed)
  Subjective:    Patient ID: Amber Willis, female    DOB: Feb 19, 1960, 50 y.o.   MRN: [id number]  HPI 1. Boil on left buttocks. Noticed boil last night.  Had cyst years ago. Had to get it cut out. Not infected.  ROS: no fevers, chills, N/V. Not particularly painful.  2. Depression Feels good. Feels table. Depression usually manifests by her being quiet. PH9Q 10. 3 for feeling bad about self. 2's for feeling down and trouble with sleep. Wants to continue medication. Feels works for her. ROS: no thoughts of HI/SI  3. HTN ROS: denies chest pain, dizziness/lightheadedness, difficulty breathing, edema  4. Menorrhagia Periods last for 2-3 weeks recently. ROS: no lightheadedness, abdominal pain; occasional vaginal dryness and "hot flashes".  Review of Systems    Objective:   Physical Exam General: NAD CV: RRR Pulm: no increased WOB, CTAB Abd: soft, non-tender, obese, non-distended, ddd  Ext: no swelling Psych: smiling, appropriate, not anxious or depressed-appearing    Assessment & Plan:

## 2010-07-12 NOTE — Assessment & Plan Note (Addendum)
Compliant with statin. Able to pay for simvastatin. Costs $9 a month at [employer].  Will check FLP at next visit in 6 months.

## 2010-07-12 NOTE — Assessment & Plan Note (Signed)
Persistent menorrhagia but stable. Does not seem to cause patient distress. Will monitor. Likely perimenopausal with associated occasional vaginal dryness and hot flashes. Hgb at last visit was okay. Patient asymptomatic today.

## 2010-07-12 NOTE — Assessment & Plan Note (Signed)
Unchanged. Encouraged weight loss. Continue to walk daily. Continue to make healthy food choices and decrease fried foods.

## 2010-07-12 NOTE — Assessment & Plan Note (Addendum)
Improved. Not depressed currently, but will continue Paxil per patient request.

## 2010-07-29 ENCOUNTER — Ambulatory Visit: Payer: Self-pay | Admitting: Family Medicine

## 2010-08-30 ENCOUNTER — Telehealth: Payer: Self-pay | Admitting: Family Medicine

## 2010-08-30 NOTE — Telephone Encounter (Signed)
Placed copy of snapshot up front [employer] [employer]

## 2010-08-30 NOTE — Telephone Encounter (Signed)
Would like a print of her medication list.  She will need it on Wednesday 8/15 she will call back about it tomorrow.

## 2010-09-02 ENCOUNTER — Inpatient Hospital Stay (INDEPENDENT_AMBULATORY_CARE_PROVIDER_SITE_OTHER)
Admission: RE | Admit: 2010-09-02 | Discharge: 2010-09-02 | Disposition: A | Discharge status: Home / Self Care | Payer: Self-pay | Source: Ambulatory Visit | Attending: Family Medicine | Admitting: Family Medicine

## 2010-09-02 DIAGNOSIS — L089 Local infection of the skin and subcutaneous tissue, unspecified: Secondary | ICD-10-CM

## 2010-11-25 ENCOUNTER — Other Ambulatory Visit: Payer: Self-pay | Admitting: Family Medicine

## 2010-11-25 DIAGNOSIS — Z1231 Encounter for screening mammogram for malignant neoplasm of breast: Secondary | ICD-10-CM

## 2010-12-30 ENCOUNTER — Ambulatory Visit: Payer: Self-pay | Admitting: Family Medicine

## 2011-01-13 ENCOUNTER — Ambulatory Visit: Payer: Self-pay | Admitting: Family Medicine

## 2011-01-13 ENCOUNTER — Ambulatory Visit: Payer: Self-pay

## 2011-06-06 ENCOUNTER — Encounter (HOSPITAL_COMMUNITY): Payer: Self-pay | Admitting: *Deleted

## 2011-06-06 ENCOUNTER — Telehealth: Payer: Self-pay | Admitting: Family Medicine

## 2011-06-06 ENCOUNTER — Observation Stay (HOSPITAL_COMMUNITY)
Admission: EM | Admit: 2011-06-06 | Discharge: 2011-06-06 | Disposition: A | Discharge status: Home Health | Payer: Self-pay | Source: Ambulatory Visit | Attending: Emergency Medicine | Admitting: Emergency Medicine

## 2011-06-06 DIAGNOSIS — R197 Diarrhea, unspecified: Secondary | ICD-10-CM | POA: Insufficient documentation

## 2011-06-06 DIAGNOSIS — E860 Dehydration: Principal | ICD-10-CM

## 2011-06-06 DIAGNOSIS — I10 Essential (primary) hypertension: Secondary | ICD-10-CM | POA: Insufficient documentation

## 2011-06-06 DIAGNOSIS — F3289 Other specified depressive episodes: Secondary | ICD-10-CM | POA: Insufficient documentation

## 2011-06-06 DIAGNOSIS — E785 Hyperlipidemia, unspecified: Secondary | ICD-10-CM | POA: Insufficient documentation

## 2011-06-06 DIAGNOSIS — R112 Nausea with vomiting, unspecified: Secondary | ICD-10-CM

## 2011-06-06 DIAGNOSIS — F329 Major depressive disorder, single episode, unspecified: Secondary | ICD-10-CM | POA: Insufficient documentation

## 2011-06-06 HISTORY — DX: Major depressive disorder, single episode, unspecified: F32.9

## 2011-06-06 HISTORY — DX: Essential (primary) hypertension: I10

## 2011-06-06 HISTORY — DX: Hyperlipidemia, unspecified: E78.5

## 2011-06-06 HISTORY — DX: Depression, unspecified: F32.A

## 2011-06-06 MED ORDER — MORPHINE SULFATE 2 MG/ML IJ SOLN
2.0000 mg | Freq: Once | INTRAMUSCULAR | Status: AC
  Administered 2011-06-06: 2 mg via INTRAVENOUS
  Filled 2011-06-06: qty 1

## 2011-06-06 MED ORDER — SODIUM CHLORIDE 0.9 % IV BOLUS (SEPSIS)
1000.0000 mL | Freq: Once | INTRAVENOUS | Status: AC
  Administered 2011-06-06: 1000 mL via INTRAVENOUS

## 2011-06-06 MED ORDER — FAMOTIDINE IN NACL 20-0.9 MG/50ML-% IV SOLN
20.0000 mg | Freq: Once | INTRAVENOUS | Status: AC
  Administered 2011-06-06: 20 mg via INTRAVENOUS
  Filled 2011-06-06: qty 50

## 2011-06-06 MED ORDER — ONDANSETRON HCL 4 MG PO TABS: 4.0000 mg | ORAL_TABLET | Freq: Four times a day (QID) | ORAL | Status: AC

## 2011-06-06 MED ORDER — SODIUM CHLORIDE 0.9 % IV SOLN: Freq: Once | INTRAVENOUS | Status: DC

## 2011-06-06 MED ORDER — ACETAMINOPHEN 325 MG PO TABS: 650.0000 mg | ORAL_TABLET | ORAL | Status: DC | PRN

## 2011-06-06 MED ORDER — ONDANSETRON HCL 4 MG/2ML IJ SOLN
4.0000 mg | Freq: Once | INTRAMUSCULAR | Status: AC
  Administered 2011-06-06: 4 mg via INTRAVENOUS
  Filled 2011-06-06: qty 2

## 2011-06-06 MED ORDER — ONDANSETRON HCL 4 MG/2ML IJ SOLN: 4.0000 mg | Freq: Four times a day (QID) | INTRAMUSCULAR | Status: DC | PRN

## 2011-06-06 NOTE — ED Notes (Addendum)
Patient states N/V/D since 0300 today, vomiting x 4 episodes with 2 containing some blood, diarrhea x 5 episodes, also c/o lower abdominal pain

## 2011-06-06 NOTE — Discharge Instructions (Signed)
B.R.A.T. DIET FOR THE NEXT 8-12 HOURS THEN ADVANCE AS TOLERATED. PUSH FLUIDS IN SMALL AMOUNTS. USE ZOFRAN FOR NAUSEA AS NEEDED. RETURN HERE WITH ANY HIGH FEVER, SEVERE PAIN, BLOODY VOMITING OR NEW CONCERN.  B.R.A.T. Diet Your doctor has recommended the B.R.A.T. diet for you or your child until the condition improves. This is often used to help control diarrhea and vomiting symptoms. If you or your child can tolerate clear liquids, you may have:  Bananas.   Rice.   Applesauce.   Toast (and other simple starches such as crackers, potatoes, noodles).  Be sure to avoid dairy products, meats, and fatty foods until symptoms are better. Fruit juices such as apple, grape, and prune juice can make diarrhea worse. Avoid these. Continue this diet for 2 days or as instructed by your caregiver. Document Released: 01/03/2005 Document Revised: 12/23/2010 Document Reviewed: 06/22/2006 [facility] Patient Information 2012 [place], [place].

## 2011-06-06 NOTE — Telephone Encounter (Signed)
Has been having diarrhea/nausea/vominting today.  Wants to know what she can do.

## 2011-06-06 NOTE — ED Provider Notes (Signed)
No further vomiting in ED, tolerating PO fluids, soft foods. She reports feeling improved. Will discharge home with medications to control vomiting and encourage PCP recheck in 2 days.  Rodena Medin, PA-C 06/06/11 1451

## 2011-06-06 NOTE — ED Notes (Signed)
CLEAR LIQUID LUNCH ORDERED FOR PT

## 2011-06-06 NOTE — ED Provider Notes (Signed)
History     CSN: [id number]  Arrival date & time 06/06/11  1040   First MD Initiated Contact with Patient 06/06/11 1202      Chief Complaint  Patient presents with  . Emesis    vomting blood x 3 episodes  . Diarrhea    x 1 day    (Consider location/radiation/quality/duration/timing/severity/associated sxs/prior treatment) HPI Comments: Pt was woken up at 0300 this AM with nausea, has had 5 episodes of vomiting, 4 of watery diarrhea.  Pt subsequently has developed some mild to moderate intensity crampy middle and lower abdominal pain, feels bloated, no radiation to chest, thighs or flanks.  No prior abd surgeries in the past.  She has a sig h/o HTN, cholesterol and depression.  No recent foreign travel, no new meds and no recent abx use.  She lives with a roommate and friend, no close contacts sick recently.  She denies SOB, fever, chills, back pain, CP.    Patient is a 51 y.o. female presenting with vomiting and diarrhea. The history is provided by the patient.  Emesis  Associated symptoms include abdominal pain and diarrhea. Pertinent negatives include no chills and no fever.  Diarrhea The primary symptoms include abdominal pain, nausea, vomiting and diarrhea. Primary symptoms do not include fever, dysuria or rash.  The illness does not include chills, constipation or back pain.    Past Medical History  Diagnosis Date  . Abnormal TSH     But normal T4.   . Flat foot   . Hypertension   . Hyperlipidemia   . Depression     History reviewed. No pertinent past surgical history.  History reviewed. No pertinent family history.  History  Substance Use Topics  . Smoking status: Never Smoker   . Smokeless tobacco: Not on file  . Alcohol Use: Not on file    OB History    Grav Para Term Preterm Abortions TAB SAB Ect Mult Living                  Review of Systems  Constitutional: Positive for appetite change. Negative for fever and chills.  Respiratory: Negative for  shortness of breath.   Cardiovascular: Negative for chest pain.  Gastrointestinal: Positive for nausea, vomiting, abdominal pain and diarrhea. Negative for constipation, blood in stool and rectal pain.  Genitourinary: Negative for dysuria and flank pain.  Musculoskeletal: Negative for back pain.  Skin: Negative for rash.  Neurological: Negative for syncope and light-headedness.  All other systems reviewed and are negative.    Allergies  Review of patient's allergies indicates no known allergies.  Home Medications   Current Outpatient Rx  Name Route Sig Dispense Refill  . LISINOPRIL-HYDROCHLOROTHIAZIDE 10-12.5 MG PO TABS Oral Take 1 tablet by mouth daily.    Marland Kitchen PAROXETINE HCL 20 MG PO TABS Oral Take 20 mg by mouth daily.    Marland Kitchen SIMVASTATIN 40 MG PO TABS Oral Take 40 mg by mouth every evening.      BP 120/85  Pulse 77  Temp(Src) 98.4 F (36.9 C) (Oral)  Resp 20  SpO2 99%  LMP 05/25/2011  Physical Exam  Nursing note and vitals reviewed. Constitutional: She is oriented to person, place, and time. She appears well-developed and well-nourished.  HENT:  Head: Normocephalic and atraumatic.  Eyes: Pupils are equal, round, and reactive to light. No scleral icterus.  Neck: Normal range of motion.  Cardiovascular: Normal rate and regular rhythm.   Pulmonary/Chest: Effort normal and breath sounds normal. She  has no rales.  Abdominal: Soft. Bowel sounds are normal. She exhibits no distension. There is no rebound and no guarding.  Musculoskeletal: She exhibits no edema.  Neurological: She is alert and oriented to person, place, and time.  Skin: Skin is warm and dry. No rash noted. No pallor.    ED Course  Procedures (including critical care time)  Labs Reviewed - No data to display No results found.   1. Nausea vomiting and diarrhea   2. Dehydration     RA sats 99% and normal.    1:10 PM Pt feels a little improved, spoke to [facility].  MDM  Pt with multiple episodes of  vomiting and diarrhea and some crampy pain after several episodes of both.  Doubt appy or diverticulitis since not tender now.  More likely gastroenteritis with associated dehydration and transient dehydration of omental circulation versus crampy discomfort from virus causing pain.  Will treat with IVF's, antiemetics, pepcid, and some morphine and monitor.  Will place in CDU dehydration protocol to rest and continue treatment.          Gavin Pound. Laisa Larrick, MD 06/06/11 1311

## 2011-06-06 NOTE — Telephone Encounter (Signed)
Patient calls back stating she  has been vomiting blood since 3:00 AM. Has vomited 5 times.  Now states she feels like she will pass out. Also having diarrhea. Advised  to to go to ED ASAP. She voices understanding.

## 2011-06-08 NOTE — ED Provider Notes (Signed)
Medical screening examination/treatment/procedure(s) were conducted as a shared visit with non-physician practitioner(s) and myself.  I personally evaluated the patient during the encounter   Please see my original H&P for further details.    Gavin Pound. Pilar Westergaard, MD 06/08/11 1549

## 2011-06-14 ENCOUNTER — Encounter: Payer: Self-pay | Admitting: Family Medicine

## 2011-06-14 ENCOUNTER — Ambulatory Visit: Payer: Self-pay

## 2011-07-13 ENCOUNTER — Other Ambulatory Visit: Payer: Self-pay | Admitting: *Deleted

## 2011-07-13 MED ORDER — LISINOPRIL-HYDROCHLOROTHIAZIDE 10-12.5 MG PO TABS: 1.0000 | ORAL_TABLET | Freq: Every day | ORAL | Status: DC

## 2011-07-13 MED ORDER — PAROXETINE HCL 20 MG PO TABS: 20.0000 mg | ORAL_TABLET | Freq: Every day | ORAL | Status: DC

## 2011-08-03 ENCOUNTER — Telehealth: Payer: Self-pay | Admitting: Family Medicine

## 2011-08-03 MED ORDER — IBUPROFEN 600 MG PO TABS: 600.0000 mg | ORAL_TABLET | Freq: Three times a day (TID) | ORAL | Status: DC | PRN

## 2011-08-03 NOTE — Telephone Encounter (Signed)
Please notify Rx has been sent.  But next time, she doesn't need to call. It is available OTC.   Also advise her to stay well hydrated and to sleep enough.  Thank you.

## 2011-08-03 NOTE — Telephone Encounter (Signed)
Patient is calling after experiencing several migraines over the last several days and is asking for an Rx for Ibuprofen to be sent to [facility] on [address].

## 2011-08-22 ENCOUNTER — Other Ambulatory Visit: Payer: Self-pay | Admitting: Family Medicine

## 2011-08-22 NOTE — Telephone Encounter (Signed)
Denied Rx for Paroxetine. Not seen at our clinic seen 06/2010.

## 2011-08-29 ENCOUNTER — Other Ambulatory Visit: Payer: Self-pay | Admitting: *Deleted

## 2011-08-29 MED ORDER — PAROXETINE HCL 20 MG PO TABS: 20.0000 mg | ORAL_TABLET | Freq: Every day | ORAL | Status: DC

## 2011-09-20 ENCOUNTER — Encounter: Payer: Self-pay | Admitting: Family Medicine

## 2011-11-22 ENCOUNTER — Ambulatory Visit: Payer: Self-pay | Admitting: Family Medicine

## 2011-11-24 ENCOUNTER — Other Ambulatory Visit: Payer: Self-pay | Admitting: Family Medicine

## 2011-12-21 ENCOUNTER — Ambulatory Visit: Payer: Self-pay | Admitting: Family Medicine

## 2011-12-26 ENCOUNTER — Telehealth: Payer: Self-pay | Admitting: Family Medicine

## 2011-12-26 NOTE — Telephone Encounter (Signed)
Patient states she felt "fainty" at work today. This happened twice today. She did not actually faint but just felt lightheaded. No chest pain , or nausea. . She has been eating regularly and drinking well. Advised that she should  be evaluated . Advised she can go to Urgent Care today or I can schedule appointment tomorrow.  States she is feeling well now. She has appointment next week and will call if she wants to schedule appointment sooner.

## 2011-12-26 NOTE — Telephone Encounter (Signed)
Pt is asking to speak with nurse - she feels lightheaded and funny.

## 2011-12-27 ENCOUNTER — Emergency Department (HOSPITAL_COMMUNITY)
Admission: EM | Admit: 2011-12-27 | Discharge: 2011-12-27 | Disposition: A | Discharge status: Home / Self Care | Payer: Self-pay | Attending: Emergency Medicine | Admitting: Emergency Medicine

## 2011-12-27 ENCOUNTER — Encounter (HOSPITAL_COMMUNITY): Payer: Self-pay | Admitting: Emergency Medicine

## 2011-12-27 DIAGNOSIS — I10 Essential (primary) hypertension: Secondary | ICD-10-CM | POA: Insufficient documentation

## 2011-12-27 DIAGNOSIS — F329 Major depressive disorder, single episode, unspecified: Secondary | ICD-10-CM | POA: Insufficient documentation

## 2011-12-27 DIAGNOSIS — R946 Abnormal results of thyroid function studies: Secondary | ICD-10-CM | POA: Insufficient documentation

## 2011-12-27 DIAGNOSIS — R51 Headache: Secondary | ICD-10-CM | POA: Insufficient documentation

## 2011-12-27 DIAGNOSIS — M2140 Flat foot [pes planus] (acquired), unspecified foot: Secondary | ICD-10-CM | POA: Insufficient documentation

## 2011-12-27 DIAGNOSIS — F3289 Other specified depressive episodes: Secondary | ICD-10-CM | POA: Insufficient documentation

## 2011-12-27 DIAGNOSIS — R42 Dizziness and giddiness: Secondary | ICD-10-CM

## 2011-12-27 DIAGNOSIS — E785 Hyperlipidemia, unspecified: Secondary | ICD-10-CM | POA: Insufficient documentation

## 2011-12-27 DIAGNOSIS — Z8639 Personal history of other endocrine, nutritional and metabolic disease: Secondary | ICD-10-CM | POA: Insufficient documentation

## 2011-12-27 DIAGNOSIS — A599 Trichomoniasis, unspecified: Secondary | ICD-10-CM

## 2011-12-27 DIAGNOSIS — Z79899 Other long term (current) drug therapy: Secondary | ICD-10-CM | POA: Insufficient documentation

## 2011-12-27 DIAGNOSIS — Z862 Personal history of diseases of the blood and blood-forming organs and certain disorders involving the immune mechanism: Secondary | ICD-10-CM | POA: Insufficient documentation

## 2011-12-27 HISTORY — DX: Disorder of thyroid, unspecified: E07.9

## 2011-12-27 LAB — CBC WITH DIFFERENTIAL/PLATELET
Basophils Relative: 1 % (ref 0–1)
Eosinophils Absolute: 0.2 10*3/uL (ref 0.0–0.7)
MCH: 26.4 pg (ref 26.0–34.0)
MCHC: 32.5 g/dL (ref 30.0–36.0)
Neutrophils Relative %: 72 % (ref 43–77)
Platelets: 205 10*3/uL (ref 150–400)
RBC: 4.47 MIL/uL (ref 3.87–5.11)

## 2011-12-27 LAB — COMPREHENSIVE METABOLIC PANEL
ALT: 8 U/L (ref 0–35)
Albumin: 3.5 g/dL (ref 3.5–5.2)
Alkaline Phosphatase: 73 U/L (ref 39–117)
Potassium: 3.9 mEq/L (ref 3.5–5.1)
Sodium: 138 mEq/L (ref 135–145)
Total Protein: 7.3 g/dL (ref 6.0–8.3)

## 2011-12-27 LAB — URINALYSIS, ROUTINE W REFLEX MICROSCOPIC
Bilirubin Urine: NEGATIVE
Glucose, UA: NEGATIVE mg/dL
Ketones, ur: NEGATIVE mg/dL
pH: 7.5 (ref 5.0–8.0)

## 2011-12-27 LAB — URINE MICROSCOPIC-ADD ON

## 2011-12-27 MED ORDER — SODIUM CHLORIDE 0.9 % IV SOLN
1000.0000 mL | Freq: Once | INTRAVENOUS | Status: AC
  Administered 2011-12-27: 1000 mL via INTRAVENOUS

## 2011-12-27 MED ORDER — METRONIDAZOLE 500 MG PO TABS: 2000.0000 mg | ORAL_TABLET | Freq: Once | ORAL | Status: DC

## 2011-12-27 MED ORDER — SODIUM CHLORIDE 0.9 % IV SOLN
1000.0000 mL | INTRAVENOUS | Status: DC
  Administered 2011-12-27: 1000 mL via INTRAVENOUS

## 2011-12-27 NOTE — ED Provider Notes (Signed)
History     CSN: [id number]  Arrival date & time 12/27/11  2956   First MD Initiated Contact with Patient 12/27/11 0701      Chief Complaint  Patient presents with  . Dizziness    (Consider location/radiation/quality/duration/timing/severity/associated sxs/prior treatment) HPI Comments: Pt was at work yesterday and started to feel lightheaded.  It was gradual in onset.  When she stood up she thought she would pass out. It occurred one time but once she was able to go outside and get some fresh air she felt better.  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  It lasted for about 15 minutes but now has resolved.  No CP or SOB.    Patient is a 51 y.o. female presenting with weakness. The history is provided by the patient.  Weakness The primary symptoms include headaches (mild yesterday, she does have migraines). Primary symptoms do not include loss of consciousness, visual change, paresthesias, focal weakness, fever, nausea or vomiting. The symptoms occurred after standing up.  The headache is present frequently. The pain from the headache is at a severity of 2/10. The headache is not associated with photophobia, visual change, neck stiffness, paresthesias, weakness or loss of balance.   Additional symptoms do not include neck stiffness, weakness, loss of balance or photophobia.    Past Medical History  Diagnosis Date  . Abnormal TSH     But normal T4.   . Flat foot   . Hypertension   . Hyperlipidemia   . Depression   . Thyroid disease     History reviewed. No pertinent past surgical history.  No family history on file.  History  Substance Use Topics  . Smoking status: Never Smoker   . Smokeless tobacco: Not on file  . Alcohol Use: No    OB History    Grav Para Term Preterm Abortions TAB SAB Ect Mult Living                  Review of Systems  Constitutional: Negative for fever.  HENT: Negative for neck stiffness.   Eyes: Negative for photophobia.   Gastrointestinal: Negative for nausea and vomiting.  Genitourinary: Negative for dysuria, vaginal discharge and pelvic pain.  Neurological: Positive for headaches (mild yesterday, she does have migraines). Negative for focal weakness, loss of consciousness, weakness, paresthesias and loss of balance.  All other systems reviewed and are negative.    Allergies  Review of patient's allergies indicates no known allergies.  Home Medications   Current Outpatient Rx  Name  Route  Sig  Dispense  Refill  . LISINOPRIL-HYDROCHLOROTHIAZIDE 10-12.5 MG PO TABS   Oral   Take 1 tablet by mouth daily.         Marland Kitchen PAROXETINE HCL 20 MG PO TABS   Oral   Take 1 tablet (20 mg total) by mouth daily.   30 tablet   3     Needs appointment for more refills.   Marland Kitchen SIMVASTATIN 40 MG PO TABS   Oral   Take 40 mg by mouth every evening.           BP 155/106  Pulse 55  Temp 98.2 F (36.8 C) (Oral)  Resp 16  SpO2 99%  LMP 12/02/2011  Physical Exam  Nursing note and vitals reviewed. Constitutional: She appears well-developed and well-nourished. No distress.  HENT:  Head: Normocephalic and atraumatic.  Right Ear: External ear normal.  Left Ear: External ear normal.  Eyes: Conjunctivae  normal are normal. Right eye exhibits no discharge. Left eye exhibits no discharge. No scleral icterus.  Neck: Neck supple. No tracheal deviation present.  Cardiovascular: Normal rate, regular rhythm and intact distal pulses.   Pulmonary/Chest: Effort normal and breath sounds normal. No stridor. No respiratory distress. She has no wheezes. She has no rales.  Abdominal: Soft. Bowel sounds are normal. She exhibits no distension. There is no tenderness. There is no rebound and no guarding.  Musculoskeletal: She exhibits no edema and no tenderness.  Neurological: She is alert. She has normal strength. No sensory deficit. Cranial nerve deficit:  no gross defecits noted. She exhibits normal muscle tone. She displays no  seizure activity. Coordination normal.  Skin: Skin is warm and dry. No rash noted.  Psychiatric: She has a normal mood and affect.    ED Course  Procedures (including critical care time)  Rate: 54  Rhythm: normal sinus rhythm  QRS Axis: normal  Intervals: normal  ST/T Wave abnormalities: normal  Conduction Disutrbances:none  Narrative Interpretation: borderline r wave progression, anterior leads  Old EKG Reviewed: none available  Labs Reviewed  CBC WITH DIFFERENTIAL - Abnormal; Notable for the following:    Hemoglobin 11.8 (*)     All other components within normal limits  COMPREHENSIVE METABOLIC PANEL - Abnormal; Notable for the following:    Glucose, Bld 106 (*)     All other components within normal limits  URINALYSIS, ROUTINE W REFLEX MICROSCOPIC - Abnormal; Notable for the following:    Leukocytes, UA SMALL (*)     All other components within normal limits  URINE MICROSCOPIC-ADD ON - Abnormal; Notable for the following:    Bacteria, UA FEW (*)     All other components within normal limits  POCT CBG (FASTING - GLUCOSE)-MANUAL ENTRY  URINE CULTURE   No results found.   1. Dizziness   2. Trichomonas       MDM  Pt with complaints of dizziness yesterday.  No sign of anemia or dysrythmia.  Bradycardia noted.  It is possible this could be related to her symptoms.  Will have her follow up with her PCP.  Considering her thyroid history may be reasonable to have that rechecked.  At this time there does not appear to be any evidence of an acute emergency medical condition and the patient appears stable for discharge with appropriate outpatient follow up.  Incidental trich noted in urine.  Will treat with flagyl        Celene Kras, MD 12/27/11 [phone]

## 2011-12-27 NOTE — ED Notes (Signed)
PT. REPORTS DIZZINESS WITH GENERALIZED WEAKNESS " ALMOST PASSED OUT AT MY WORK" YESTERDAY .

## 2011-12-27 NOTE — Discharge Instructions (Signed)
Dizziness Dizziness is a common problem. It is a feeling of unsteadiness or lightheadedness. You may feel like you are about to faint. Dizziness can lead to injury if you stumble or fall. A person of any age group can suffer from dizziness, but dizziness is more common in older adults. CAUSES  Dizziness can be caused by many different things, including:  Middle ear problems.  Standing for too long.  Infections.  An allergic reaction.  Aging.  An emotional response to something, such as the sight of blood.  Side effects of medicines.  Fatigue.  Problems with circulation or blood pressure.  Excess use of alcohol, medicines, or illegal drug use.  Breathing too fast (hyperventilation).  An arrhythmia or problems with your heart rhythm.  Low red blood cell count (anemia).  Pregnancy.  Vomiting, diarrhea, fever, or other illnesses that cause dehydration.  Diseases or conditions such as Parkinson's disease, high blood pressure (hypertension), diabetes, and thyroid problems.  Exposure to extreme heat. DIAGNOSIS  To find the cause of your dizziness, your caregiver may do a physical exam, lab tests, radiologic imaging scans, or an electrocardiography test (ECG).  TREATMENT  Treatment of dizziness depends on the cause of your symptoms and can vary greatly. HOME CARE INSTRUCTIONS   Drink enough fluids to keep your urine clear or pale yellow. This is especially important in very hot weather. In the elderly, it is also important in cold weather.  If your dizziness is caused by medicines, take them exactly as directed. When taking blood pressure medicines, it is especially important to get up slowly.  Rise slowly from chairs and steady yourself until you feel okay.  In the morning, first sit up on the side of the bed. When this seems okay, stand slowly while holding onto something until you know your balance is fine.  If you need to stand in one place for a long time, be sure to  move your legs often. Tighten and relax the muscles in your legs while standing.  If dizziness continues to be a problem, have someone stay with you for a day or two. Do this until you feel you are well enough to stay alone. Have the person call your caregiver if he or she notices changes in you that are concerning.  Do not drive or use heavy machinery if you feel dizzy.  Do not drink alcohol. SEEK IMMEDIATE MEDICAL CARE IF:   Your dizziness or lightheadedness gets worse.  You feel nauseous or vomit.  You develop problems with talking, walking, weakness, or using your arms, hands, or legs.  You are not thinking clearly or you have difficulty forming sentences. It may take a friend or family member to determine if your thinking is normal.  You develop chest pain, abdominal pain, shortness of breath, or sweating.  Your vision changes.  You notice any bleeding.  You have side effects from medicine that seems to be getting worse rather than better. MAKE SURE YOU:   Understand these instructions.  Will watch your condition.  Will get help right away if you are not doing well or get worse. Document Released: 06/29/2000 Document Revised: 03/28/2011 Document Reviewed: 07/23/2010 [facility] Patient Information 2013 [place], [place]. Trichomoniasis Trichomoniasis is an infection, caused by the Trichomonas organism, that affects both women and men. In women, the outer female genitalia and the vagina are affected. In men, the penis is mainly affected, but the prostate and other reproductive organs can also be involved. Trichomoniasis is a sexually transmitted  disease (STD) and is most often passed to another person through sexual contact. The majority of people who get trichomoniasis do so from a sexual encounter and are also at risk for other STDs. CAUSES   Sexual intercourse with an infected partner.  It can be present in swimming pools or hot tubs. SYMPTOMS   Abnormal gray-green frothy  vaginal discharge in women.  Vaginal itching and irritation in women.  Itching and irritation of the area outside the vagina in women.  Penile discharge with or without pain in males.  Inflammation of the urethra (urethritis), causing painful urination.  Bleeding after sexual intercourse. RELATED COMPLICATIONS  Pelvic inflammatory disease.  Infection of the uterus (endometritis).  Infertility.  Tubal (ectopic) pregnancy.  It can be associated with other STDs, including gonorrhea and chlamydia, hepatitis B, and HIV. COMPLICATIONS DURING PREGNANCY  Early (premature) delivery.  Premature rupture of the membranes (PROM).  Low birth weight. DIAGNOSIS   Visualization of Trichomonas under the microscope from the vagina discharge.  Ph of the vagina greater than 4.5, tested with a test tape.  Trich Rapid Test.  Culture of the organism, but this is not usually needed.  It may be found on a Pap test.  Having a "strawberry cervix,"which means the cervix looks very red like a strawberry. TREATMENT   You may be given medication to fight the infection. Inform your caregiver if you could be or are pregnant. Some medications used to treat the infection should not be taken during pregnancy.  Over-the-counter medications or creams to decrease itching or irritation may be recommended.  Your sexual partner will need to be treated if infected. HOME CARE INSTRUCTIONS   Take all medication prescribed by your caregiver.  Take over-the-counter medication for itching or irritation as directed by your caregiver.  Do not have sexual intercourse while you have the infection.  Do not douche or wear tampons.  Discuss your infection with your partner, as your partner may have acquired the infection from you. Or, your partner may have been the person who transmitted the infection to you.  Have your sex partner examined and treated if necessary.  Practice safe, informed, and protected  sex.  See your caregiver for other STD testing. SEEK MEDICAL CARE IF:   You still have symptoms after you finish the medication.  You have an oral temperature above 102 F (38.9 C).  You develop belly (abdominal) pain.  You have pain when you urinate.  You have bleeding after sexual intercourse.  You develop a rash.  The medication makes you sick or makes you throw up (vomit). Document Released: 06/29/2000 Document Revised: 03/28/2011 Document Reviewed: 07/25/2008 [facility] Patient Information 2013 [place], [place].

## 2011-12-27 NOTE — ED Notes (Signed)
Dr Lynelle Doctor in to discuss results and discharge plan with pt

## 2011-12-27 NOTE — ED Notes (Signed)
Pt reports becoming dizzy and "almost passing out" while at work yesterday am. States that she became dizzy and had generalized weakness. Came to the ER for further evaluation. Pt neuro intact, equal strength. No weakness or dizziness noted on way to the room. Pt denies dizziness and weakness at this time. Pt has a history of HTN, and reports taking her blood pressure as prescribed.

## 2011-12-27 NOTE — ED Notes (Signed)
lmp nov 15. States menses are regular. States that some of her menses are heavier than others. Pt states also that prior to both episodes she had not eaten

## 2011-12-27 NOTE — ED Notes (Signed)
Patient reports she had an incedent yesterday around 10 am while at work that she felt hot and flushed like she would "pass out" . (works at [employer]) . States she felt better after she was able to get somewhere cool. Stated she had not eaten. States this morning it happened while she was riding on the bus. States she felt hot and a little nauseated and again like she woud "pass out" . Denies weakness or numbness of extremeties , change in thought or speech processes, denies headache. States feels better now. She also denies any headache.

## 2011-12-28 LAB — URINE CULTURE

## 2011-12-30 ENCOUNTER — Telehealth: Payer: Self-pay | Admitting: Family Medicine

## 2011-12-30 NOTE — Telephone Encounter (Signed)
Pt has been having trouble sleeping for the past few nights and wants to know if she can increase Paxil to help her sleep.  Pls let her know

## 2011-12-30 NOTE — Telephone Encounter (Signed)
She has been under more stress lately and is having problems sleeping this week.  She was on Paxil 30 mg in the past but was doing "really well" and so the dose was decreased.   She may increase Paxil, although it may not help her sleep right away.  She will try this.   She already has follow-up with me next week.

## 2012-01-04 ENCOUNTER — Ambulatory Visit: Payer: Self-pay | Admitting: Family Medicine

## 2012-02-08 ENCOUNTER — Ambulatory Visit: Payer: Self-pay | Admitting: Family Medicine

## 2012-03-28 ENCOUNTER — Telehealth: Payer: Self-pay | Admitting: Family Medicine

## 2012-03-28 NOTE — Telephone Encounter (Signed)
Patient is calling because she thinks her bp meds are causing her to have headaches and she wants to know if she needs to stop taking them or how she should treat the headaches.

## 2012-03-29 NOTE — Telephone Encounter (Signed)
LMOM  Advising pt to sched OV to discuss headaches.

## 2012-04-03 ENCOUNTER — Encounter: Payer: Self-pay | Admitting: Family Medicine

## 2012-04-03 ENCOUNTER — Other Ambulatory Visit: Payer: Self-pay | Admitting: Family Medicine

## 2012-04-03 ENCOUNTER — Ambulatory Visit (INDEPENDENT_AMBULATORY_CARE_PROVIDER_SITE_OTHER): Payer: Self-pay | Admitting: Family Medicine

## 2012-04-03 VITALS — BP 126/74 | HR 59 | Temp 97.6°F | Ht 63.0 in | Wt 215.6 lb

## 2012-04-03 DIAGNOSIS — F339 Major depressive disorder, recurrent, unspecified: Secondary | ICD-10-CM

## 2012-04-03 DIAGNOSIS — Z23 Encounter for immunization: Secondary | ICD-10-CM

## 2012-04-03 DIAGNOSIS — R928 Other abnormal and inconclusive findings on diagnostic imaging of breast: Secondary | ICD-10-CM

## 2012-04-03 DIAGNOSIS — Z1231 Encounter for screening mammogram for malignant neoplasm of breast: Secondary | ICD-10-CM

## 2012-04-03 DIAGNOSIS — I10 Essential (primary) hypertension: Secondary | ICD-10-CM

## 2012-04-03 DIAGNOSIS — E785 Hyperlipidemia, unspecified: Secondary | ICD-10-CM

## 2012-04-03 DIAGNOSIS — Z0000 Encounter for general adult medical examination without abnormal findings: Secondary | ICD-10-CM | POA: Insufficient documentation

## 2012-04-03 MED ORDER — PAROXETINE HCL 20 MG PO TABS: 20.0000 mg | ORAL_TABLET | Freq: Every day | ORAL | Status: DC

## 2012-04-03 MED ORDER — IBUPROFEN 600 MG PO TABS: 600.0000 mg | ORAL_TABLET | Freq: Three times a day (TID) | ORAL | Status: AC | PRN

## 2012-04-03 MED ORDER — LISINOPRIL-HYDROCHLOROTHIAZIDE 10-12.5 MG PO TABS: 1.0000 | ORAL_TABLET | Freq: Every day | ORAL | Status: DC

## 2012-04-03 NOTE — Assessment & Plan Note (Signed)
She was advised to schedule mammogram and given number.

## 2012-04-03 NOTE — Assessment & Plan Note (Signed)
It seems to be under fair control at this time. Increase Paxil since she is on low dose.

## 2012-04-03 NOTE — Assessment & Plan Note (Signed)
Per above.  Stool cards in lieu of colonoscopy.  Flu shot today.

## 2012-04-03 NOTE — Progress Notes (Signed)
  Subjective:    Patient ID: Amber Willis, female    DOB: 05-11-1960, 52 y.o.   MRN: [id number]  HPI # Hypertension  She takes blood pressure medicine twice a day morning and nighttime. Last took about an hour ago.  She forgets to take medication about 1/7 days She does not check her blood pressure outside of clinic.   She endorses occasional lightheadedness that lasts for the day. She also endorses dry mouth at nighttime.  ROS: denies chest pain, difficulty breathing, headache (last headache last week)  # Depression  One tablet once daily (20 mg) Forgets 1/7 days  She feels like medication helps her deal with stressors  Alleviated by: spending time alone Exacerbated by: people who nag on her nerves  # HLD Not taking Zocor  # Preventative   Review of Systems Per HPI She still has periods, regular, monthly Endorses occasional urinary frequency/urgency; she has overactive bladder; she does not wear pads   Allergies, medication, past medical history reviewed.  Smoking status noted. Never smoked Works at [employer] with boyfriend. Wears condoms all the time with intercourse.      Objective:   Physical Exam GEN: NAD; obese HEENT: MMM CV: RRR, 2/6 systolic murmur RUSB PULM: NI WOB; CTAB without w/r/r ABD: soft, NT, DN; abdominal girth EXT: no pitting pre-tibial edema NEURO: moves all extremities well    Assessment & Plan:

## 2012-04-03 NOTE — Assessment & Plan Note (Signed)
It seems like her blood pressure may be running low at times. Stop her evening dose of lisinopril/HCTZ. Check ambulatory blood pressures. I will call her to discuss plan and follow-up. Check BMET today as well.

## 2012-04-03 NOTE — Patient Instructions (Addendum)
Flu shot today  Please call and schedule a mammogram [phone]  We will send you home with hemoccult cards. Please bring back and we will check for blood.   Please make an appointment to see Dr. Raymondo Band (pharmacist) for ambulatory blood pressure monitoring, and we can check your cholesterol at that time as well. Make a morning appointment and do not eat after midnight the night before.   PAXIL: Take 1.5 tablets for 1 week. If you do OK on this dose, go up to 2 tablets.    Menopause Menopause is the normal time of life when menstrual periods stop completely. Menopause is complete when you have missed 12 consecutive menstrual periods. It usually occurs between the ages of 48 to 59, with an average age of 47. Very rarely does a woman develop menopause before 51 years old. At menopause, your ovaries stop producing the female hormones, estrogen and progesterone. This can cause undesirable symptoms and also affect your health. Sometimes the symptoms may occur 4 to 5 years before the menopause begins. There is no relationship between menopause and:  Oral contraceptives.  Number of children you had.  Race.  The age your menstrual periods started (menarche). Heavy smokers and very thin women may develop menopause earlier in life. CAUSES  The ovaries stop producing the female hormones estrogen and progesterone.  Other causes include:  Surgery to remove both ovaries.  The ovaries stop functioning for no known reason.  Tumors of the pituitary gland in the brain.  Medical disease that affects the ovaries and hormone production.  Radiation treatment to the abdomen or pelvis.  Chemotherapy that affects the ovaries. SYMPTOMS   Hot flashes.  Night sweats.  Decrease in sex drive.  Vaginal dryness and thinning of the vagina causing painful intercourse.  Dryness of the skin and developing wrinkles.  Headaches.  Tiredness.  Irritability.  Memory problems.  Weight  gain.  Bladder infections.  Hair growth of the face and chest.  Infertility. More serious symptoms include:  Loss of bone (osteoporosis) causing breaks (fractures).  Depression.  Hardening and narrowing of the arteries (atherosclerosis) causing heart attacks and strokes. DIAGNOSIS   When the menstrual periods have stopped for 12 straight months.  Physical exam.  Hormone studies of the blood. TREATMENT  There are many treatment choices and nearly as many questions about them. The decisions to treat or not to treat menopausal changes is an individual choice made with your caregiver. Your caregiver can discuss the treatments with you. Together, you can decide which treatment will work best for you. Your treatment choices may include:   Hormone therapy (estorgen and progesterone).  Non-hormonal medications.  Treating the individual symptoms with medication (for example antidepressants for depression).  Herbal medications that may help specific symptoms.  Counseling by a psychiatrist or psychologist.  Group therapy.  Lifestyle changes including:  Eating healthy.  Regular exercise.  Limiting caffeine and alcohol.  Stress management and meditation.  No treatment. HOME CARE INSTRUCTIONS   Take the medication your caregiver gives you as directed.  Get plenty of sleep and rest.  Exercise regularly.  Eat a diet that contains calcium (good for the bones) and soy products (acts like estrogen hormone).  Avoid alcoholic beverages.  Do not smoke.  If you have hot flashes, dress in layers.  Take supplements, calcium and vitamin D to strengthen bones.  You can use over-the-counter lubricants or moisturizers for vaginal dryness.  Group therapy is sometimes very helpful.  Acupuncture may be helpful  in some cases. SEEK MEDICAL CARE IF:   You are not sure you are in menopause.  You are having menopausal symptoms and need advice and treatment.  You are still  having menstrual periods after age 38.  You have pain with intercourse.  Menopause is complete (no menstrual period for 12 months) and you develop vaginal bleeding.  You need a referral to a specialist (gynecologist, psychiatrist or psychologist) for treatment. SEEK IMMEDIATE MEDICAL CARE IF:   You have severe depression.  You have excessive vaginal bleeding.  You fell and think you have a broken bone.  You have pain when you urinate.  You develop leg or chest pain.  You have a fast pounding heart beat (palpitations).  You have severe headaches.  You develop vision problems.  You feel a lump in your breast.  You have abdominal pain or severe indigestion. Document Released: 03/26/2003 Document Revised: 03/28/2011 Document Reviewed: 11/01/2007 [facility] Patient Information 2013 [place], [place].

## 2012-04-03 NOTE — Assessment & Plan Note (Signed)
Future order for lipid panel. She is not taking statin.

## 2012-04-19 ENCOUNTER — Ambulatory Visit: Payer: Self-pay | Admitting: Pharmacist

## 2012-04-19 ENCOUNTER — Other Ambulatory Visit: Payer: Self-pay

## 2012-05-07 ENCOUNTER — Other Ambulatory Visit: Payer: Self-pay

## 2012-05-07 ENCOUNTER — Ambulatory Visit: Payer: Self-pay | Admitting: Pharmacist

## 2012-05-10 ENCOUNTER — Ambulatory Visit: Payer: Self-pay | Admitting: Pharmacist

## 2012-05-10 ENCOUNTER — Other Ambulatory Visit: Payer: Self-pay

## 2012-05-29 ENCOUNTER — Other Ambulatory Visit: Payer: Self-pay

## 2012-05-29 ENCOUNTER — Ambulatory Visit: Payer: Self-pay | Admitting: Pharmacist

## 2012-06-12 ENCOUNTER — Ambulatory Visit: Payer: Self-pay | Admitting: Pharmacist

## 2012-06-12 ENCOUNTER — Other Ambulatory Visit: Payer: Self-pay | Admitting: Family Medicine

## 2012-06-12 ENCOUNTER — Other Ambulatory Visit: Payer: Self-pay

## 2012-06-12 DIAGNOSIS — E785 Hyperlipidemia, unspecified: Secondary | ICD-10-CM

## 2012-06-12 LAB — BASIC METABOLIC PANEL
CO2: 24 mEq/L (ref 19–32)
Calcium: 9 mg/dL (ref 8.4–10.5)
Sodium: 134 mEq/L — ABNORMAL LOW (ref 135–145)

## 2012-06-12 LAB — LIPID PANEL
HDL: 47 mg/dL (ref >39)
LDL Cholesterol: 128 mg/dL — ABNORMAL HIGH (ref 0–99)

## 2012-06-12 NOTE — Progress Notes (Signed)
BMP AND FLP DONE TODAY Amber Willis 

## 2012-06-13 ENCOUNTER — Encounter: Payer: Self-pay | Admitting: Family Medicine

## 2012-09-13 ENCOUNTER — Telehealth: Payer: Self-pay | Admitting: Family Medicine

## 2012-09-13 NOTE — Telephone Encounter (Signed)
Pt is requesting a refill on paroxetine be sent to her pharmacy. The notes that Dr. Madolyn Frieze stated that she needed an appointment before any refills were given. She didn't want the appointment that we had instead wanted to wait until October. She would like the refill to go to pharmacy on file. JW

## 2012-09-14 MED ORDER — PAROXETINE HCL 20 MG PO TABS: 20.0000 mg | ORAL_TABLET | Freq: Every day | ORAL | Status: DC

## 2012-09-14 NOTE — Telephone Encounter (Signed)
I sent the prescription over. Thanks.

## 2012-09-14 NOTE — Telephone Encounter (Signed)
LMOVM informing pt that "the rx requested was sent to your pharmacy, please call with any issues". Fleeger, Maryjo Rochester

## 2012-09-18 ENCOUNTER — Other Ambulatory Visit: Payer: Self-pay | Admitting: *Deleted

## 2012-09-18 MED ORDER — PAROXETINE HCL 20 MG PO TABS: 20.0000 mg | ORAL_TABLET | Freq: Every day | ORAL | Status: DC

## 2012-09-18 NOTE — Telephone Encounter (Signed)
PLease verify directions on paxil and resend - thanks! Wyatt Haste, RN-BSN

## 2012-09-18 NOTE — Telephone Encounter (Signed)
Prescription was fixed to take 1 tablet daily. Thank you.

## 2012-10-18 ENCOUNTER — Ambulatory Visit: Payer: Self-pay | Admitting: Family Medicine

## 2012-11-07 ENCOUNTER — Ambulatory Visit: Payer: Self-pay | Admitting: Family Medicine

## 2012-12-11 ENCOUNTER — Ambulatory Visit: Payer: Self-pay | Admitting: Family Medicine

## 2012-12-25 ENCOUNTER — Ambulatory Visit: Payer: Self-pay | Admitting: Family Medicine

## 2013-04-02 ENCOUNTER — Telehealth: Payer: Self-pay | Admitting: Family Medicine

## 2013-04-02 NOTE — Telephone Encounter (Signed)
LMOVM for pt to return call back.  She will need an appt first. She has never met Dr. Lamar Benes. Karsten Howry, Salome Spotted

## 2013-04-02 NOTE — Telephone Encounter (Signed)
Pt called and needs a refill on her Paroxetine. jw

## 2013-04-16 ENCOUNTER — Telehealth: Payer: Self-pay | Admitting: *Deleted

## 2013-04-16 NOTE — Telephone Encounter (Signed)
Pt called stating she has been having severe headaches for two days.  Pt denies any nausea, vomiting, blurred vision or aura with headaches.  Pt stated she has taken tylenol with some relief and cold compress to forehead.  Pt stated she can't take Ibuprofen to bleeding issue.  Advised pt to schedule appt for evaluation but decline.  Pt stated she has an upcoming appt 04/22/2013 with PCP to follow up on medication and will discuss headaches with provider at that time. Pt told to try going in dark room, limit noise and continue tylenol and cold compress to forehead.  Derl Barrow, RN

## 2013-04-22 ENCOUNTER — Ambulatory Visit (INDEPENDENT_AMBULATORY_CARE_PROVIDER_SITE_OTHER): Payer: Self-pay | Admitting: Family Medicine

## 2013-04-22 ENCOUNTER — Encounter: Payer: Self-pay | Admitting: Family Medicine

## 2013-04-22 VITALS — BP 128/80 | HR 74 | Temp 97.9°F | Wt 220.0 lb

## 2013-04-22 DIAGNOSIS — I10 Essential (primary) hypertension: Secondary | ICD-10-CM

## 2013-04-22 DIAGNOSIS — Z0000 Encounter for general adult medical examination without abnormal findings: Secondary | ICD-10-CM

## 2013-04-22 DIAGNOSIS — F339 Major depressive disorder, recurrent, unspecified: Secondary | ICD-10-CM

## 2013-04-22 MED ORDER — SIMVASTATIN 40 MG PO TABS: 40.0000 mg | ORAL_TABLET | Freq: Every evening | ORAL | Status: DC

## 2013-04-22 MED ORDER — LISINOPRIL-HYDROCHLOROTHIAZIDE 10-12.5 MG PO TABS: 1.0000 | ORAL_TABLET | Freq: Every day | ORAL | Status: DC

## 2013-04-22 MED ORDER — PAROXETINE HCL 20 MG PO TABS: 20.0000 mg | ORAL_TABLET | Freq: Every day | ORAL | Status: DC

## 2013-04-22 NOTE — Patient Instructions (Signed)
Please call the [facility] to schedule a Mammogram  For your headaches, you can try tylenol or excedrin migraine. If these do not help, please call the clinic and we will look into a different prescription.

## 2013-04-22 NOTE — Assessment & Plan Note (Signed)
BP at goal, no changes to medications P: refill for lisinopril-hctz

## 2013-04-22 NOTE — Assessment & Plan Note (Signed)
PHQ9 13. Denies SI/HI. Will continue paxil (refill given), re-eval PHQ9 at pap visit in 1 month.

## 2013-04-22 NOTE — Progress Notes (Signed)
Patient ID: Amber Willis, female   DOB: 27-Jan-1960, 53 y.o.   MRN: [id number]   Subjective:    Patient ID: Amber Willis, female    DOB: 1960/06/01, 53 y.o.   MRN: [id number]  HPI  CC: med refill  # Migraine:  Occurs with menstrual cycle  Primarily front of head, has some associated symptoms including light sensitivity  Can be present for a few hours to the entire cycle  Takes ibuprofen with some relief but thinks it is causing her to bleed a little more ROS: denies numbness, weakness of extremities, dizziness  # Depression  PHQ9 13 (2,1,2,1,1,1,3,2,0)  Has more good days than bad  Denies SI/HI ROS: denies weight changes, CP, SOB  # Healthcare maintenance  Has not had colonoscopy, mammogram recently.  Review of Systems   See HPI for ROS. Objective:  BP 128/80  Pulse 74  Temp(Src) 97.9 F (36.6 C) (Oral)  Wt 220 lb (99.791 kg)  LMP 04/17/2013  General: NAD Cardiac: RRR, normal heart sounds, no murmurs. 2+ radial and PT pulses bilaterally Respiratory: CTAB, normal effort Extremities: no edema or cyanosis. WWP. Neuro: alert and oriented, no focal deficits Psych: mood good, affect congruent. Normal prosody of speech.     Assessment & Plan:  See Problem List Documentation

## 2013-04-22 NOTE — Assessment & Plan Note (Signed)
Colonoscopy: interested in getting this done. In process of getting health insurance (says it will be done in May); interested in having it done then Mammogram: info given for imaging center Pap smear: schedule for pap in the next ~1 month

## 2013-05-01 ENCOUNTER — Other Ambulatory Visit: Payer: Self-pay

## 2013-05-01 DIAGNOSIS — Z1231 Encounter for screening mammogram for malignant neoplasm of breast: Secondary | ICD-10-CM

## 2013-05-21 ENCOUNTER — Ambulatory Visit: Payer: Self-pay | Admitting: Family Medicine

## 2013-05-30 ENCOUNTER — Ambulatory Visit: Payer: Self-pay | Admitting: Family Medicine

## 2013-05-30 ENCOUNTER — Ambulatory Visit: Payer: Self-pay

## 2013-06-12 ENCOUNTER — Telehealth: Payer: Self-pay | Admitting: Family Medicine

## 2013-06-12 NOTE — Telephone Encounter (Signed)
Has taken tylenol and excedrin migraine but nothing is working. Would like something stronger called in  Please advise

## 2013-06-12 NOTE — Telephone Encounter (Signed)
Informed patient that she would need to be seen before anything stronger could be called in. She states she will call back to schedule an appt.

## 2014-02-13 ENCOUNTER — Telehealth: Payer: Self-pay | Admitting: Family Medicine

## 2014-02-13 NOTE — Telephone Encounter (Signed)
Pt is having left knee pain and has tried heat wraps and ibuprofen.  Is considering trying the copper knee brace.  She made an appt to come in on Tuesday to see DR. Street. Lexxie Winberg,CMA

## 2014-02-13 NOTE — Telephone Encounter (Signed)
LM for patient to call back.  Please ask her for more details on her knee issues. Ellasyn Swilling,CMA

## 2014-02-13 NOTE — Telephone Encounter (Signed)
Pt called and would like to talk to the nurse for Dr. Lamar Benes concerning her left knee. She is looking for something OTC. jw

## 2014-02-18 ENCOUNTER — Ambulatory Visit: Payer: Self-pay | Admitting: Family Medicine

## 2014-04-25 ENCOUNTER — Other Ambulatory Visit: Payer: Self-pay | Admitting: Family Medicine

## 2014-04-29 ENCOUNTER — Telehealth: Payer: Self-pay | Admitting: Family Medicine

## 2014-04-29 NOTE — Telephone Encounter (Signed)
Will forward to MD to see if she can get enough to last until her appt. Romelle Muldoon,CMA

## 2014-04-29 NOTE — Telephone Encounter (Signed)
Pt called to make an appointment to see her doctor she has one for 4/20, but will be out of her depression medication before her appointment and wanted to see if the doctor can call in enough to get her to her appointment. jw

## 2014-04-29 NOTE — Telephone Encounter (Signed)
Patient is aware of this. Jazmin Hartsell,CMA  

## 2014-04-29 NOTE — Telephone Encounter (Signed)
Pt called requesting that her medication be refilled today due to her being out.  She was offered an appt today to be seen by B. Sheryle Hail but declined and wouldn't agree to make one soon.  Pt states that she "will go to the ED for her medication."  Per Pamala Hurry she was advised against that.  Will forward to MD to make him aware. Kimbra Marcelino,CMA

## 2014-04-30 NOTE — Telephone Encounter (Signed)
Rx was sent yesterday. -Dr. Lamar Benes

## 2014-05-07 ENCOUNTER — Ambulatory Visit: Payer: Self-pay | Admitting: Family Medicine

## 2014-06-18 ENCOUNTER — Ambulatory Visit: Payer: Self-pay | Admitting: Family Medicine

## 2014-06-18 ENCOUNTER — Telehealth: Payer: Self-pay | Admitting: Family Medicine

## 2014-06-18 ENCOUNTER — Other Ambulatory Visit: Payer: Self-pay | Admitting: Family Medicine

## 2014-06-18 MED ORDER — PAROXETINE HCL 20 MG PO TABS: ORAL_TABLET | ORAL | Status: DC

## 2014-06-18 NOTE — Telephone Encounter (Signed)
Is out of her paxil. No appts in June and July schedule not up yet Please advise

## 2014-06-18 NOTE — Telephone Encounter (Signed)
Refill request from pt. Will forward to PCP for review. Amber Willis, CMA. 

## 2014-06-19 NOTE — Telephone Encounter (Signed)
Refill request from pharmacy. Will forward to PCP for review. Lambros Cerro, CMA. 

## 2014-08-13 ENCOUNTER — Ambulatory Visit: Payer: Self-pay | Admitting: Family Medicine

## 2014-08-15 ENCOUNTER — Other Ambulatory Visit: Payer: Self-pay | Admitting: Family Medicine

## 2014-08-15 NOTE — Telephone Encounter (Signed)
Pt is requesting refill on paxil, [facility] to [facility]

## 2014-08-28 ENCOUNTER — Encounter: Payer: Self-pay | Admitting: Family Medicine

## 2014-09-01 ENCOUNTER — Other Ambulatory Visit: Payer: Self-pay | Admitting: Family Medicine

## 2014-09-01 MED ORDER — PAROXETINE HCL 20 MG PO TABS: ORAL_TABLET | ORAL | Status: DC

## 2014-09-01 NOTE — Telephone Encounter (Signed)
LM for patient to call back.  She needs an appt to refill this medication per last refill note.  Patient was scheduled for 08-28-14 but no showed appt.  Please help her schedule an appt with PCP when she calls back. Jazmin Hartsell,CMA

## 2014-09-01 NOTE — Telephone Encounter (Signed)
Pt called and needs a refill on her Paxil. jw

## 2014-10-14 ENCOUNTER — Telehealth: Payer: Self-pay | Admitting: Family Medicine

## 2014-10-14 MED ORDER — PAROXETINE HCL 20 MG PO TABS: ORAL_TABLET | ORAL | Status: DC

## 2014-10-14 NOTE — Telephone Encounter (Signed)
Has appt 10-31-14. Would like enough  paraxetine to last until her appt

## 2014-10-14 NOTE — Telephone Encounter (Signed)
Rx sent 

## 2014-10-14 NOTE — Telephone Encounter (Signed)
Refill request from pt. Will forward to PCP for review. Halima Fogal, CMA. 

## 2014-10-16 ENCOUNTER — Encounter: Payer: Self-pay | Admitting: Family Medicine

## 2014-10-31 ENCOUNTER — Encounter: Payer: Self-pay | Admitting: Family Medicine

## 2014-11-17 ENCOUNTER — Encounter: Payer: Self-pay | Admitting: Family Medicine

## 2014-11-25 ENCOUNTER — Telehealth: Payer: Self-pay | Admitting: Family Medicine

## 2014-11-25 ENCOUNTER — Encounter: Payer: Self-pay | Admitting: Family Medicine

## 2014-11-25 NOTE — Telephone Encounter (Signed)
Appt was rescheduled to nov 22 because of transportation. Would like refill on her Paxil

## 2014-11-25 NOTE — Telephone Encounter (Signed)
Refill request from pt. Will forward to PCP for review. Irmgard Rampersaud, CMA. 

## 2014-12-01 MED ORDER — PAROXETINE HCL 20 MG PO TABS: ORAL_TABLET | ORAL | Status: DC

## 2014-12-01 NOTE — Telephone Encounter (Signed)
Refill sent.

## 2014-12-09 ENCOUNTER — Encounter: Payer: Self-pay | Admitting: Family Medicine

## 2014-12-29 ENCOUNTER — Encounter: Payer: Self-pay | Admitting: Family Medicine

## 2014-12-29 ENCOUNTER — Ambulatory Visit (INDEPENDENT_AMBULATORY_CARE_PROVIDER_SITE_OTHER): Payer: Self-pay | Admitting: Family Medicine

## 2014-12-29 VITALS — BP 157/95 | HR 63 | Temp 98.1°F | Ht 63.0 in | Wt 223.0 lb

## 2014-12-29 DIAGNOSIS — M25562 Pain in left knee: Secondary | ICD-10-CM

## 2014-12-29 DIAGNOSIS — I10 Essential (primary) hypertension: Secondary | ICD-10-CM

## 2014-12-29 DIAGNOSIS — Z0000 Encounter for general adult medical examination without abnormal findings: Secondary | ICD-10-CM

## 2014-12-29 MED ORDER — PAROXETINE HCL 20 MG PO TABS: ORAL_TABLET | ORAL | Status: DC

## 2014-12-29 MED ORDER — LISINOPRIL-HYDROCHLOROTHIAZIDE 10-12.5 MG PO TABS: 1.0000 | ORAL_TABLET | Freq: Every day | ORAL | Status: DC

## 2014-12-29 MED ORDER — SIMVASTATIN 40 MG PO TABS: 40.0000 mg | ORAL_TABLET | Freq: Every evening | ORAL | Status: DC

## 2014-12-29 NOTE — Progress Notes (Signed)
   Subjective:    Patient ID: Amber Willis, female    DOB: [birth year], 54 y.o.   MRN: [id number]  HPI  CC: follow up  # Mood:  Good today  Has some days are worse than others  Still taking paxil, says she sometimes feels like she needs to take more (on the "worse" days)  No GI issues ROS: no suicidal ideation or HI  # Knee pain  left knee, near front  Has tried ibuprofen in the past, uses creams  X-rays in the past, has arthritis and has gotten shots.   Staying home and rests it improves.   # Hypertension  Says she did not take her lisinopril-hctz today  Says only missed about 3 doses in past 1 month ROS: no changes in vision, no SOB, no CP  Social Hx: never smoker  Review of Systems   See HPI for ROS.   Past medical history, surgical, family, and social history reviewed and updated in the EMR as appropriate. Objective:  BP 157/95 mmHg  Pulse 63  Temp(Src) 98.1 F (36.7 C) (Oral)  Ht 5\' 3"  (1.6 m)  Wt 223 lb (101.152 kg)  BMI 39.51 kg/m2 Vitals and nursing note reviewed  General: NAD Eyes: PERRL, EOMI. Normal conjunctiva and sclera  ENTM: normal oral mucosa, no posterior pharyngeal lesions Neck: supple CV: RRR, normal s1s2, no murmurs, rubs or gallop. 2+ radial pulses Resp: clear to auscultation bilaterally, normal effort Abdomen: soft, obese, nontender, nondistended, normal bowel sounds  Extremities: normal muscle bulk/tone, no deformities. No LE edema Skin: no rashes Neuro: alert and oriented, no deficits Psych: normal mood & affect, normal thought content & speech    Assessment & Plan:  Preventative health care Without insurance currently limits some of our options (this was an issue at her last visit where she reported she was getting insurance soon). Needs pap smear, mammo, colonoscopy, hep C. These will all be deferred due to cost; asked her to schedule appointment with [facility] financial counselor to see if she would qualify for orange  card.  HYPERTENSION, BENIGN SYSTEMIC Not at goal today, missed today's dose. At last visit she was well controlled. Stressed importance of taking medicine every day and especially on clinic visit days. Will re-check at follow up visit for pap smear.  Left anterior knee pain Describing some patellofemoral type symptoms. Also was told she had DJD in the past. At this point recommended continuing OTC analgesics, take preemptively on days she expects to have pain. Given some HEP exercises. RTC if symptoms worsen or wants to consider steroid injection. Follow up as needed.

## 2014-12-29 NOTE — Patient Instructions (Signed)
You need to come back in about 1 month:  We will do your pap smear, check blood work, check blood pressure  Schedule an appointment with the financial counselor before you leave today.  For your knee, work on some of the exercises in the print out. For the days you expect the pain to get worse, take tylenol (500-650mg ) throughout the day (3-4 times) as a "preventive" medicine. If the pain becomes bad enough and you want to do an injection come back to the clinic.

## 2015-01-01 DIAGNOSIS — M25561 Pain in right knee: Secondary | ICD-10-CM | POA: Insufficient documentation

## 2015-01-01 DIAGNOSIS — M25562 Pain in left knee: Secondary | ICD-10-CM

## 2015-01-01 NOTE — Assessment & Plan Note (Signed)
Describing some patellofemoral type symptoms. Also was told she had DJD in the past. At this point recommended continuing OTC analgesics, take preemptively on days she expects to have pain. Given some HEP exercises. RTC if symptoms worsen or wants to consider steroid injection. Follow up as needed.

## 2015-01-01 NOTE — Assessment & Plan Note (Signed)
Not at goal today, missed today's dose. At last visit she was well controlled. Stressed importance of taking medicine every day and especially on clinic visit days. Will re-check at follow up visit for pap smear.

## 2015-01-01 NOTE — Assessment & Plan Note (Signed)
Without insurance currently limits some of our options (this was an issue at her last visit where she reported she was getting insurance soon). Needs pap smear, mammo, colonoscopy, hep C. These will all be deferred due to cost; asked her to schedule appointment with [facility] financial counselor to see if she would qualify for orange card.

## 2015-02-09 ENCOUNTER — Ambulatory Visit: Payer: Self-pay | Admitting: Family Medicine

## 2015-08-06 ENCOUNTER — Other Ambulatory Visit: Payer: Self-pay | Admitting: Family Medicine

## 2015-08-06 MED ORDER — PAROXETINE HCL 20 MG PO TABS: ORAL_TABLET | ORAL | Status: DC

## 2015-08-06 NOTE — Telephone Encounter (Signed)
Refilled paroxetine to preferred pharmacy

## 2015-08-06 NOTE — Telephone Encounter (Signed)
[facility] [address]  She only has 2 pills left

## 2016-01-21 ENCOUNTER — Other Ambulatory Visit: Payer: Self-pay | Admitting: Family Medicine

## 2016-01-21 MED ORDER — PAROXETINE HCL 20 MG PO TABS: ORAL_TABLET | ORAL | 1 refills | Status: DC

## 2016-01-21 NOTE — Telephone Encounter (Signed)
Pt needs a refill on paroxetine. Pt uses [facility] on [address] ep

## 2016-01-21 NOTE — Addendum Note (Signed)
Addended by: Derl Barrow on: 01/21/2016 11:31 AM   Modules accepted: Orders

## 2016-01-22 ENCOUNTER — Telehealth: Payer: Self-pay | Admitting: *Deleted

## 2016-01-22 NOTE — Telephone Encounter (Signed)
-----   Message from Steve Rattler, DO sent at 01/21/2016 12:04 PM EST -----  Can she be scheduled for an appointment? Has not been seen since December 2016. Thanks!

## 2016-01-22 NOTE — Telephone Encounter (Signed)
LM for patient to call back.  Please assist her in making an appointment with PCP within a month to discuss her blood pressure medication before any other refills can be made.  Makenzey Nanni,CMA

## 2016-01-26 NOTE — Telephone Encounter (Signed)
Letter mailed to patient. Jazmin Hartsell,CMA  

## 2016-02-24 ENCOUNTER — Ambulatory Visit: Payer: Self-pay | Admitting: Family Medicine

## 2016-02-29 ENCOUNTER — Ambulatory Visit: Payer: Self-pay | Admitting: Family Medicine

## 2016-03-23 ENCOUNTER — Encounter: Payer: Self-pay | Admitting: Family Medicine

## 2016-03-23 ENCOUNTER — Ambulatory Visit (INDEPENDENT_AMBULATORY_CARE_PROVIDER_SITE_OTHER): Payer: BLUE CROSS/BLUE SHIELD | Admitting: Family Medicine

## 2016-03-23 VITALS — BP 174/102 | HR 72 | Temp 97.8°F | Ht 63.0 in | Wt 245.4 lb

## 2016-03-23 DIAGNOSIS — I10 Essential (primary) hypertension: Secondary | ICD-10-CM | POA: Diagnosis not present

## 2016-03-23 DIAGNOSIS — E782 Mixed hyperlipidemia: Secondary | ICD-10-CM | POA: Diagnosis not present

## 2016-03-23 DIAGNOSIS — Z23 Encounter for immunization: Secondary | ICD-10-CM

## 2016-03-23 DIAGNOSIS — Z0000 Encounter for general adult medical examination without abnormal findings: Secondary | ICD-10-CM

## 2016-03-23 DIAGNOSIS — M25562 Pain in left knee: Secondary | ICD-10-CM

## 2016-03-23 DIAGNOSIS — F331 Major depressive disorder, recurrent, moderate: Secondary | ICD-10-CM | POA: Diagnosis not present

## 2016-03-23 LAB — BASIC METABOLIC PANEL
BUN: 12 mg/dL (ref 7–25)
CO2: 26 mmol/L (ref 20–31)
Calcium: 9.1 mg/dL (ref 8.6–10.4)
Chloride: 101 mmol/L (ref 98–110)
Creat: 0.7 mg/dL (ref 0.50–1.05)
Glucose, Bld: 83 mg/dL (ref 65–99)
Potassium: 4.1 mmol/L (ref 3.5–5.3)
SODIUM: 139 mmol/L (ref 135–146)

## 2016-03-23 LAB — LIPID PANEL
CHOL/HDL RATIO: 4 ratio (ref <5.0)
CHOLESTEROL: 191 mg/dL (ref <200)
HDL: 48 mg/dL — AB (ref >50)
LDL Cholesterol: 127 mg/dL — ABNORMAL HIGH (ref <100)
TRIGLYCERIDES: 79 mg/dL (ref <150)
VLDL: 16 mg/dL (ref <30)

## 2016-03-23 MED ORDER — PAROXETINE HCL 20 MG PO TABS: 20.0000 mg | ORAL_TABLET | Freq: Every day | ORAL | 5 refills | Status: DC

## 2016-03-23 MED ORDER — LISINOPRIL-HYDROCHLOROTHIAZIDE 10-12.5 MG PO TABS: 1.0000 | ORAL_TABLET | Freq: Every day | ORAL | 11 refills | Status: DC

## 2016-03-23 NOTE — Assessment & Plan Note (Addendum)
  Stable, patient reports doing well. PHQ9 today 11 but patient relates this more to knee pain vs mood induced.  -refilled paxil 20mg  daily -follow up at next appointment

## 2016-03-23 NOTE — Progress Notes (Signed)
Subjective:    Patient ID: Amber Willis, female    DOB: 31-Mar-1960, 56 y.o.   MRN: [id number]  CC: here for follow up  Has not been seen in several years. Patient did not have a reason for not coming in. States she is doing well. Has occasional knee pain worse with rainy weather. Reports difficulty falling asleep. No other complaints at this time.  PMH- HTN, depression, arthritis Meds- paxil, lisinopril-hctz, tylenol arthritis  Allergies- none Surg Hx- cyst removal FH- sister and mom have DM. Dad's side has heart problems SH- works at [employer], lives with friend, no pets, no children, has a lot of family in [place]  Depression- feels like she's doing well. Missed a dose of medicine last week and had headache after. Describes mood as a 5/10. Denies SI/HI.  HTN- BP elevated today. Forgot to take medication today. Misses medication about 2x a week. Denies chest pain, SOB, vision changes. Does have headaches but that is common for her. She forgets to take this because she's afraid to take it with her Paxil. Will take Paxil first then sometimes forget BP meds.  Difficulty sleeping- hard time falling asleep. Drinks caffeine. Drinks sodas in the afternoon/at night. Will lay awake for almost an hour trying to fall asleep. Goes to bed at 8:30, wakes up at 4:30 am for work.   Knee pain- chronic problem. Has had steroid shot in past which helped a lot. Takes tylenol as needed for pain. Feels like her knee pain slows her down significantly especially first thing in the morning.   Review of Systems- see HPI   Objective:  BP (!) 174/102   Pulse 72   Temp 97.8 F (36.6 C) (Oral)   Ht 5\' 3"  (1.6 m)   Wt 245 lb 6.4 oz (111.3 kg)   LMP 01/03/2016 (Exact Date)   SpO2 99%   BMI 43.47 kg/m  Vitals and nursing note reviewed  Manual recheck of BP 172/98  General: well nourished, in no acute distress HEENT: normocephalic, PERRL. no scleral icterus or conjunctival pallor, no nasal discharge, moist  mucous membranes, poor dentition without erythema or discharge noted in posterior oropharynx Neck: supple, non-tender, without lymphadenopathy Cardiac: RRR, clear S1 and S2, no murmurs, rubs, or gallops Respiratory: clear to auscultation bilaterally, no increased work of breathing Abdomen: obese abdomen. Soft, NTND Extremities: no edema or cyanosis. Neuro: alert and oriented, no focal deficits   Assessment & Plan:    Left anterior knee pain  Chronic, secondary to OA. Worse in morning/rainy days  -recommended follow up appt in 1-2 weeks to get knee injected per patient's preference -continue tylenol prn  Preventative health care  Behind on several screening/health maintenance points due to not coming in for 2-3 years.  -Patient will get flu shot today -Information given for patient to schedule mammogram and colonoscopy at her leisure -She will schedule appointment in May with me to get Pap smear done  HYPERLIPIDEMIA, MILD  Currently not taking any medication, previously was on simvastatin 40mg  daily with no side effects  -will check lipid panel today   HYPERTENSION, BENIGN SYSTEMIC  BP elevated today 174/102 and 172/98. Patient did not take BP medication today. She forgets frequently. When she does take it she says her BP is normal. In lieu of increasing dosage emphasized compliance  -refilled BP medications -recheck in 1-2 weeks at knee injection appt -can increase medication if still uncontrolled  -checking BMP today  Major depressive disorder, recurrent episode ([place])  Stable, patient reports doing well. PHQ9 today 11 but patient relates this more to knee pain vs mood induced.  -refilled paxil 20mg  daily -follow up at next appointment   Return in about 6 weeks (around 05/04/2016).   Lucila Maine, DO Family Medicine Resident PGY-1

## 2016-03-23 NOTE — Assessment & Plan Note (Addendum)
  Chronic, secondary to OA. Worse in morning/rainy days  -recommended follow up appt in 1-2 weeks to get knee injected per patient's preference -continue tylenol prn

## 2016-03-23 NOTE — Assessment & Plan Note (Addendum)
  BP elevated today 174/102 and 172/98. Patient did not take BP medication today. She forgets frequently. When she does take it she says her BP is normal. In lieu of increasing dosage emphasized compliance  -refilled BP medications -recheck in 1-2 weeks at knee injection appt -can increase medication if still uncontrolled  -checking BMP today

## 2016-03-23 NOTE — Assessment & Plan Note (Signed)
  Currently not taking any medication, previously was on simvastatin 40mg  daily with no side effects  -will check lipid panel today

## 2016-03-23 NOTE — Patient Instructions (Signed)
  Schedule an appointment for the next 2-3 weeks for a knee injection.  See me in May for pap smear.  Call if you need anything!

## 2016-03-23 NOTE — Assessment & Plan Note (Addendum)
  Behind on several screening/health maintenance points due to not coming in for 2-3 years.  -Patient will get flu shot today -Information given for patient to schedule mammogram and colonoscopy at her leisure -She will schedule appointment in May with me to get Pap smear done

## 2016-03-24 ENCOUNTER — Other Ambulatory Visit: Payer: Self-pay | Admitting: Family Medicine

## 2016-03-24 DIAGNOSIS — E785 Hyperlipidemia, unspecified: Secondary | ICD-10-CM

## 2016-03-24 MED ORDER — ATORVASTATIN CALCIUM 40 MG PO TABS: 40.0000 mg | ORAL_TABLET | Freq: Every day | ORAL | 3 refills | Status: DC

## 2016-03-25 NOTE — Progress Notes (Unsigned)
Attempted to call pt- no answer, a VM was left to call the office.

## 2016-04-04 ENCOUNTER — Telehealth: Payer: Self-pay | Admitting: Family Medicine

## 2016-04-04 NOTE — Telephone Encounter (Signed)
No answer. LMOVM. Called to confirm/remind patient about scheduled appointment and to advise to bring medications and to arrive early for check in.

## 2016-04-05 ENCOUNTER — Ambulatory Visit: Payer: BLUE CROSS/BLUE SHIELD | Admitting: Family Medicine

## 2016-04-28 ENCOUNTER — Other Ambulatory Visit: Payer: Self-pay | Admitting: Family Medicine

## 2016-04-28 DIAGNOSIS — Z1231 Encounter for screening mammogram for malignant neoplasm of breast: Secondary | ICD-10-CM

## 2016-05-09 ENCOUNTER — Ambulatory Visit: Payer: BLUE CROSS/BLUE SHIELD | Admitting: Family Medicine

## 2016-05-23 ENCOUNTER — Ambulatory Visit: Payer: BLUE CROSS/BLUE SHIELD

## 2016-05-23 ENCOUNTER — Ambulatory Visit: Payer: BLUE CROSS/BLUE SHIELD | Admitting: Family Medicine

## 2016-06-14 ENCOUNTER — Ambulatory Visit (INDEPENDENT_AMBULATORY_CARE_PROVIDER_SITE_OTHER): Payer: BLUE CROSS/BLUE SHIELD | Admitting: Family Medicine

## 2016-06-14 ENCOUNTER — Encounter: Payer: BLUE CROSS/BLUE SHIELD | Admitting: Family Medicine

## 2016-06-14 ENCOUNTER — Other Ambulatory Visit (HOSPITAL_COMMUNITY)
Admission: RE | Admit: 2016-06-14 | Discharge: 2016-06-14 | Disposition: A | Discharge status: Home / Self Care | Payer: BLUE CROSS/BLUE SHIELD | Source: Ambulatory Visit | Attending: Family Medicine | Admitting: Family Medicine

## 2016-06-14 ENCOUNTER — Encounter: Payer: Self-pay | Admitting: Family Medicine

## 2016-06-14 VITALS — BP 118/60 | HR 75 | Temp 98.0°F | Wt 236.8 lb

## 2016-06-14 DIAGNOSIS — I10 Essential (primary) hypertension: Secondary | ICD-10-CM

## 2016-06-14 DIAGNOSIS — G8929 Other chronic pain: Secondary | ICD-10-CM

## 2016-06-14 DIAGNOSIS — Z124 Encounter for screening for malignant neoplasm of cervix: Secondary | ICD-10-CM

## 2016-06-14 DIAGNOSIS — M25562 Pain in left knee: Secondary | ICD-10-CM | POA: Diagnosis not present

## 2016-06-14 DIAGNOSIS — Z113 Encounter for screening for infections with a predominantly sexual mode of transmission: Secondary | ICD-10-CM | POA: Diagnosis not present

## 2016-06-14 DIAGNOSIS — R87610 Atypical squamous cells of undetermined significance on cytologic smear of cervix (ASC-US): Secondary | ICD-10-CM | POA: Insufficient documentation

## 2016-06-14 DIAGNOSIS — E785 Hyperlipidemia, unspecified: Secondary | ICD-10-CM | POA: Diagnosis not present

## 2016-06-14 DIAGNOSIS — F331 Major depressive disorder, recurrent, moderate: Secondary | ICD-10-CM | POA: Diagnosis not present

## 2016-06-14 DIAGNOSIS — M25561 Pain in right knee: Secondary | ICD-10-CM

## 2016-06-14 MED ORDER — PAROXETINE HCL 20 MG PO TABS: 20.0000 mg | ORAL_TABLET | Freq: Every day | ORAL | 3 refills | Status: DC

## 2016-06-14 NOTE — Assessment & Plan Note (Addendum)
  Chronic, stable. PHQ 9 today at 15, up from 11 at last visit but patient feels depression is well controlled  -continue paxil 20 mg daily, rx refilled today -follow up if symptoms worsen

## 2016-06-14 NOTE — Assessment & Plan Note (Signed)
  Uncontrolled, not currently on any medication  -asked patient to pick up Rx for lipitor today -patient advised to call if medication too costly -encouraged exercise, weight loss

## 2016-06-14 NOTE — Assessment & Plan Note (Addendum)
  Chronic, secondary to OA and obesity  -scheduled for knee injection next week (patient states L pain worse than R but would be open to bilateral injections) -continue exercise and weight loss

## 2016-06-14 NOTE — Progress Notes (Signed)
    Subjective:    Patient ID: Amber Willis, female    DOB: May 23, 1960, 56 y.o.   MRN: [id number]   CC: here for annual physical exam  Overall doing well.   Lipids -did not pick up cholesterol medication in March -agreed to pick it up today -used to take statin but this was discontinued due to cost  Depression -taking paxil 20 mg every day, feels like things are going well from a depression standpoint -denies SI, HI  HTN -taking BP meds daily -very happy with BP today -has been walking at night to lose weight, down 10 pounds -denies CP, SOB -does endorse some feet swelling, works on her feet M-F  Knee Pain -Chronic, has had injections in the past -Walking for exercise -Knees hurt from standing all day at work -denies joint swelling, injury  Smoking status reviewed- non-smoker  Review of Systems- see HPI   Objective:  BP 118/60   Pulse 75   Temp 98 F (36.7 C) (Oral)   Wt 236 lb 12.8 oz (107.4 kg)   LMP 01/18/2016 (Approximate)   SpO2 96%   BMI 41.95 kg/m  Vitals and nursing note reviewed  General: well nourished, in no acute distress Cardiac: RRR, clear S1 and S2, no murmurs, rubs, or gallops Respiratory: clear to auscultation bilaterally, no increased work of breathing GU: normal external female genitalia. Cervix pink, no lesions noted.  Extremities: trace edema up to ankles bilaterally  Assessment & Plan:    HYPERTENSION, BENIGN SYSTEMIC  Chronic, well controlled, at goal today  -continue current medication regimen -continue to encourage exercise and weight loss  Hyperlipidemia  Uncontrolled, not currently on any medication  -asked patient to pick up Rx for lipitor today -patient advised to call if medication too costly -encouraged exercise, weight loss  Major depressive disorder, recurrent episode (HCC)  Chronic, stable. PHQ 9 today at 15, up from 11 at last visit but patient feels depression is well controlled  -continue paxil 20 mg  daily -follow up if symptoms worsen  Bilateral knee pain  Chronic, secondary to OA and obesity  -scheduled for knee injection next week (patient states L pain worse than R but would be open to bilateral injections) -continue exercise and weight loss  Health Maintenance -scheduled for mammo next month -information given for colonoscopy -pap smear done today with gc/chlamydia testing -patient due for tetanus shot  Return in about 6 months (around 12/15/2016), or if symptoms worsen or fail to improve.   Lucila Maine, DO Family Medicine Resident PGY-1

## 2016-06-14 NOTE — Assessment & Plan Note (Signed)
  Chronic, well controlled, at goal today  -continue current medication regimen -continue to encourage exercise and weight loss

## 2016-06-14 NOTE — Patient Instructions (Signed)
  It was great seeing you today!  Please go pick up your cholesterol medication from your pharmacy. If it is too costly, please call us so I can send something else in for you.  I'll send you a letter with your results from today.  If you have questions or concerns please do not hesitate to call at [phone].  Lucila Maine, DO PGY-1, [facility] 06/14/2016 9:05 AM

## 2016-06-15 LAB — CERVICOVAGINAL ANCILLARY ONLY
CHLAMYDIA, DNA PROBE: NEGATIVE
Neisseria Gonorrhea: NEGATIVE

## 2016-06-19 ENCOUNTER — Telehealth: Payer: Self-pay | Admitting: Family Medicine

## 2016-06-19 LAB — CYTOLOGY - PAP
DIAGNOSIS: UNDETERMINED — AB
HPV (WINDOPATH): DETECTED — AB

## 2016-06-19 MED ORDER — METRONIDAZOLE 500 MG PO TABS: 2000.0000 mg | ORAL_TABLET | Freq: Once | ORAL | 0 refills | Status: AC

## 2016-06-19 NOTE — Telephone Encounter (Signed)
Attempted to call Amber Willis to let her know pap results. Sent in treatment for Trich to her pharmacy. She needs to be scheduled for colpo due to Ascus and HPV on pap.

## 2016-06-20 NOTE — Telephone Encounter (Signed)
LM for patient to call back.  Will try calling her again tomorrow. Laprecious Austill,CMA

## 2016-06-21 NOTE — Telephone Encounter (Signed)
Have tried calling patient on both numbers listed for her including her emergency contact.  LM for patient on both of these voicemails to call the office back. Jazmin Hartsell,CMA

## 2016-06-22 ENCOUNTER — Ambulatory Visit: Payer: BLUE CROSS/BLUE SHIELD | Admitting: Family Medicine

## 2016-06-27 NOTE — Telephone Encounter (Signed)
Patient has appointment on 6/13 with Dr. Rosalyn Gess, if she comes to this we can relay the results to her in person and ensure she got the antibiotic.

## 2016-06-27 NOTE — Telephone Encounter (Signed)
Will forward note to Dr. Mingo Amber so he is aware of this need. Jazmin Hartsell,CMA

## 2016-06-28 NOTE — Telephone Encounter (Signed)
Forwarding to Dr. Rosalyn Gess as forwarded to me by mistake.

## 2016-06-29 ENCOUNTER — Ambulatory Visit: Payer: BLUE CROSS/BLUE SHIELD | Admitting: Family Medicine

## 2016-07-15 ENCOUNTER — Ambulatory Visit: Payer: BLUE CROSS/BLUE SHIELD

## 2016-07-29 ENCOUNTER — Encounter: Payer: BLUE CROSS/BLUE SHIELD | Admitting: Family Medicine

## 2016-08-31 ENCOUNTER — Ambulatory Visit: Payer: BLUE CROSS/BLUE SHIELD

## 2016-09-28 ENCOUNTER — Ambulatory Visit: Payer: BLUE CROSS/BLUE SHIELD

## 2017-03-06 ENCOUNTER — Emergency Department (HOSPITAL_COMMUNITY)
Admission: EM | Admit: 2017-03-06 | Discharge: 2017-03-06 | Disposition: A | Discharge status: Home / Self Care | Payer: BLUE CROSS/BLUE SHIELD | Attending: Emergency Medicine | Admitting: Emergency Medicine

## 2017-03-06 ENCOUNTER — Emergency Department (HOSPITAL_COMMUNITY): Payer: BLUE CROSS/BLUE SHIELD

## 2017-03-06 ENCOUNTER — Encounter (HOSPITAL_COMMUNITY): Payer: Self-pay

## 2017-03-06 DIAGNOSIS — M1711 Unilateral primary osteoarthritis, right knee: Secondary | ICD-10-CM | POA: Insufficient documentation

## 2017-03-06 DIAGNOSIS — I10 Essential (primary) hypertension: Secondary | ICD-10-CM | POA: Diagnosis not present

## 2017-03-06 DIAGNOSIS — M25561 Pain in right knee: Secondary | ICD-10-CM | POA: Diagnosis present

## 2017-03-06 MED ORDER — NAPROXEN 500 MG PO TABS: 500.0000 mg | ORAL_TABLET | Freq: Two times a day (BID) | ORAL | 0 refills | Status: DC

## 2017-03-06 NOTE — Discharge Instructions (Signed)
You were seen in the emergency department for pain and swelling in your right knee.  Your x-ray did not show any fracture or dislocation, there were findings consistent with arthritis.   I have prescribed you naproxen this is a nonsteroidal anti-inflammatory medication.  You may take this once as needed every 12 hours for pain and swelling, be sure to take this with food as it can cause stomach upset and at worst stomach bleeding.  Do not take other nonsteroidal anti-inflammatories while taking this medicine including Goody powder, Advil, Aleve, or Motrin.  You may supplement with Tylenol.  Apply ice to the knee 20 minutes on 40 minutes off for the next 24-48 hours to be sure to use a towel to wrap the ice in. You may continue to take Tylenol with this medication.   Follow-up with the orthopedic doctor that I have provided in your discharge instructions within 1 week if you have not had improvement in your symptoms.  Return to the emergency department for any new or worsening symptoms including but not limited to fever, chills, numbness, weakness, inability to walk, redness of the knee, increase the pain or swelling.

## 2017-03-06 NOTE — ED Provider Notes (Signed)
[facility] EMERGENCY DEPARTMENT Provider Note   CSN: [id number] Arrival date & time: 03/06/17  1517     History   Chief Complaint No chief complaint on file.   HPI Amber Willis is a 57 y.o. female with a hx of HTN and depression who presents to the ED with complaint of R knee pain that started yesterday AM. Rates pain a 9/10 in severity, worsens with ambulating and with flexion, improves with rest. Patient states she tried Tylenol arthritis without relief. Reports mild associated swelling intermittently. Hx of similar pain- diagnosed with arthritis, has not seen an orthopedic provider. Denies injury, fall, or change in activity.  Denies fevers, chills, erythema, numbness, or weakness.   HPI  Past Medical History:  Diagnosis Date  . Abnormal TSH    But normal T4.   . Depression   . Flat foot(734)   . Hyperlipidemia   . Hypertension   . Thyroid disease     Patient Active Problem List   Diagnosis Date Noted  . Bilateral knee pain 01/01/2015  . Preventative health care 04/03/2012  . Hyperlipidemia 11/18/2008  . MAMMOGRAM, ABNORMAL, RIGHT 05/20/2008  . OBESITY, NOS 03/16/2006  . Major depressive disorder, recurrent episode ([place]) 03/16/2006  . HYPERTENSION, BENIGN SYSTEMIC 03/16/2006  . MENOPAUSAL SYNDROME 03/16/2006    History reviewed. No pertinent surgical history.  OB History    No data available       Home Medications    Prior to Admission medications   Medication Sig Start Date End Date Taking? Authorizing Provider  atorvastatin (LIPITOR) 40 MG tablet Take 1 tablet (40 mg total) by mouth daily. 03/24/16 03/24/17  Steve Rattler, DO  lisinopril-hydrochlorothiazide (PRINZIDE,ZESTORETIC) 10-12.5 MG tablet Take 1 tablet by mouth daily. 03/23/16   Steve Rattler, DO  PARoxetine (PAXIL) 20 MG tablet Take 1 tablet (20 mg total) by mouth daily. TAKE ONE TABLET BY MOUTH ONCE DAILY 06/14/16   Steve Rattler, DO    Family History History reviewed. No  pertinent family history.  Social History Social History   Tobacco Use  . Smoking status: Never Smoker  . Smokeless tobacco: Never Used  Substance Use Topics  . Alcohol use: No  . Drug use: No     Allergies   Patient has no known allergies.   Review of Systems Review of Systems  Constitutional: Negative for chills and fever.  Musculoskeletal: Positive for arthralgias (R knee) and joint swelling (R knee).  Neurological: Negative for weakness and numbness.   Physical Exam Updated Vital Signs BP 132/74 (BP Location: Left Arm)   Pulse 62   Temp 97.9 F (36.6 C) (Oral)   Resp 18   SpO2 97%   Physical Exam  Constitutional: She appears well-developed and well-nourished. No distress.  HENT:  Head: Normocephalic and atraumatic.  Eyes: Conjunctivae are normal. Right eye exhibits no discharge. Left eye exhibits no discharge.  Cardiovascular:  Pulses:      Dorsalis pedis pulses are 2+ on the right side, and 2+ on the left side.       Posterior tibial pulses are 2+ on the right side, and 2+ on the left side.  Musculoskeletal:  Lower extremities: There is no obvious deformity, significant swelling, erythema, ecchymosis, or overlying warmth.  Patient has full range of motion of bilateral ankles.  She has full range of motion of the left knee.  She has full range of motion with extension of the right knee, flexion is minimally limited secondary  to pain, patient is able to flex past 90 degrees.  Patient's right knee is is tender at the lateral joint line extending diffusely to the lateral aspect of the knee as well as diffusely anteriorly.  There is no point focal tenderness.  Lower extremities otherwise nontender.  Neurological: She is alert.  Clear speech.  Sensation grossly intact to bilateral lower extremities.  She has 5 out of 5 strength with knee flexion and extension and with ankle plantar and dorsiflexion bilaterally.  Patellar DTRs are 2+ and symmetric.  Gait is intact.    Psychiatric: She has a normal mood and affect. Her behavior is normal. Thought content normal.  Nursing note and vitals reviewed.   ED Treatments / Results  Labs (all labs ordered are listed, but only abnormal results are displayed) Labs Reviewed - No data to display  EKG  EKG Interpretation None       Radiology Dg Knee Complete 4 Views Right  Result Date: 03/06/2017 CLINICAL DATA:  Right knee pain times 2-3 days EXAM: RIGHT KNEE - COMPLETE 4+ VIEW COMPARISON:  None. FINDINGS: Tricompartmental osteoarthritis of the patellofemoral and femorotibial compartments with spurring and joint space narrowing more markedly affecting the patellofemoral compartment. Mild chondrocalcinosis of hyaline cartilage within the medial femorotibial compartment. Trace joint effusion. No acute fracture joint dislocations. No suspicious osseous lesions. IMPRESSION: Tricompartmental osteoarthritis more severely affecting the patellofemoral compartment. Trace suprapatellar joint effusion. No acute osseous abnormality. Electronically Signed   By: [name] Royalty M.D.   On: 03/06/2017 17:32    Procedures Procedures (including critical care time)  Medications Ordered in ED Medications - No data to display   Initial Impression / Assessment and Plan / ED Course  I have reviewed the triage vital signs and the nursing notes.  Pertinent labs & imaging results that were available during my care of the patient were reviewed by me and considered in my medical decision making (see chart for details).    Patient presents with R knee pain/swelling which she states feels similar to previous problems with arthritis.  Patient is nontoxic appearing, in no apparent distress, vitals are WNL. Patient with mildly restricted range of motion secondary to pain- unable to perform full flexion, however she is able to flex past 90 degress.  Patient is afebrile and she is without systemic symptoms, erythema, or redness of the joint  consistent with gout or septic joint.  Patient X-Ray negative for obvious fracture or dislocation, findings consistent with tricompartmental osteoarthritis with trace suprapatellar joint effusion. She is NVI distally.  Will prescribe naproxen (last creatinine WNL) with orthopedics follow up. I discussed results, treatment plan, need for ortho follow-up, and return precautions with the patient. Provided opportunity for questions, patient confirmed understanding and is in agreement with plan.   Final Clinical Impressions(s) / ED Diagnoses   Final diagnoses:  Osteoarthritis of right knee, unspecified osteoarthritis type    ED Discharge Orders        Ordered    naproxen (NAPROSYN) 500 MG tablet  2 times daily     03/06/17 1751       Netanya Yazdani, [place], PA-C 03/06/17 1831    Julianne Rice, MD 03/09/17 1024

## 2017-03-06 NOTE — ED Triage Notes (Signed)
Patient complains of right knee pain x 2-3 days. Pain only with ROM. Denies trauma

## 2017-03-28 ENCOUNTER — Other Ambulatory Visit: Payer: Self-pay

## 2017-03-28 ENCOUNTER — Ambulatory Visit (INDEPENDENT_AMBULATORY_CARE_PROVIDER_SITE_OTHER): Payer: BLUE CROSS/BLUE SHIELD | Admitting: Family Medicine

## 2017-03-28 ENCOUNTER — Encounter: Payer: Self-pay | Admitting: Family Medicine

## 2017-03-28 ENCOUNTER — Other Ambulatory Visit: Payer: Self-pay | Admitting: *Deleted

## 2017-03-28 ENCOUNTER — Other Ambulatory Visit (HOSPITAL_COMMUNITY)
Admission: RE | Admit: 2017-03-28 | Discharge: 2017-03-28 | Disposition: A | Discharge status: Home / Self Care | Payer: BLUE CROSS/BLUE SHIELD | Source: Ambulatory Visit | Attending: Family Medicine | Admitting: Family Medicine

## 2017-03-28 VITALS — BP 126/80 | HR 59 | Temp 97.5°F | Wt 238.0 lb

## 2017-03-28 DIAGNOSIS — Z8619 Personal history of other infectious and parasitic diseases: Secondary | ICD-10-CM | POA: Diagnosis not present

## 2017-03-28 DIAGNOSIS — M25562 Pain in left knee: Secondary | ICD-10-CM | POA: Diagnosis not present

## 2017-03-28 DIAGNOSIS — Z1231 Encounter for screening mammogram for malignant neoplasm of breast: Secondary | ICD-10-CM | POA: Diagnosis not present

## 2017-03-28 DIAGNOSIS — Z87898 Personal history of other specified conditions: Secondary | ICD-10-CM

## 2017-03-28 DIAGNOSIS — I10 Essential (primary) hypertension: Secondary | ICD-10-CM | POA: Diagnosis not present

## 2017-03-28 DIAGNOSIS — Z8742 Personal history of other diseases of the female genital tract: Secondary | ICD-10-CM

## 2017-03-28 DIAGNOSIS — Z0000 Encounter for general adult medical examination without abnormal findings: Secondary | ICD-10-CM

## 2017-03-28 DIAGNOSIS — M25561 Pain in right knee: Secondary | ICD-10-CM | POA: Diagnosis not present

## 2017-03-28 DIAGNOSIS — Z1239 Encounter for other screening for malignant neoplasm of breast: Secondary | ICD-10-CM

## 2017-03-28 DIAGNOSIS — Z0001 Encounter for general adult medical examination with abnormal findings: Secondary | ICD-10-CM | POA: Diagnosis not present

## 2017-03-28 DIAGNOSIS — G8929 Other chronic pain: Secondary | ICD-10-CM

## 2017-03-28 LAB — POCT WET PREP (WET MOUNT)
Clue Cells Wet Prep Whiff POC: NEGATIVE
Trichomonas Wet Prep HPF POC: ABSENT

## 2017-03-28 MED ORDER — LISINOPRIL-HYDROCHLOROTHIAZIDE 10-12.5 MG PO TABS: 1.0000 | ORAL_TABLET | Freq: Every day | ORAL | 11 refills | Status: DC

## 2017-03-28 MED ORDER — NAPROXEN 375 MG PO TABS
375.0000 mg | ORAL_TABLET | Freq: Two times a day (BID) | ORAL | 2 refills | Status: DC
Start: 2017-03-28 — End: 2017-08-10

## 2017-03-28 MED ORDER — ATORVASTATIN CALCIUM 40 MG PO TABS: 40.0000 mg | ORAL_TABLET | Freq: Every day | ORAL | 3 refills | Status: DC

## 2017-03-28 MED ORDER — PAROXETINE HCL 20 MG PO TABS: 20.0000 mg | ORAL_TABLET | Freq: Every day | ORAL | 3 refills | Status: DC

## 2017-03-28 MED ORDER — LISINOPRIL-HYDROCHLOROTHIAZIDE 10-12.5 MG PO TABS
1.0000 | ORAL_TABLET | Freq: Every day | ORAL | 11 refills | Status: DC
Start: 2017-03-28 — End: 2017-12-18

## 2017-03-28 MED ORDER — METHYLPREDNISOLONE ACETATE 40 MG/ML IJ SUSP
40.0000 mg | Freq: Once | INTRAMUSCULAR | Status: AC
  Administered 2017-03-28: 40 mg via INTRAMUSCULAR

## 2017-03-28 NOTE — Progress Notes (Signed)
   Subjective:    Patient ID: [name], female    DOB: 05-28-1960, 57 y.o.   MRN: [id number]   CC: R knee pain  Knee pain- was seen in ED a few weeks ago with knee pain. Was give naproxen. Xray showed arthritis. She has had pain in the past and injections helped. She is interested in injection today. She does not want surgery on the knee for replacement now or in future.   Pap- last pap was abnormal and had trich +. We were unable to reach patient, she was unaware of these results. Denies vaginal discharge. Sexually active with husband. She is open to repeat pap today and to recheck for stds.   Smoking status reviewed- non-smoker  Review of Systems- no fevers, chills, knee warmth or redness   Objective:  BP 126/80   Pulse (!) 59   Temp (!) 97.5 F (36.4 C) (Oral)   Wt 238 lb (108 kg)   LMP 01/17/2017   SpO2 99%   BMI 42.16 kg/m  Vitals and nursing note reviewed  General: obese female, pleasant, well nourished, in no acute distress Cardiac: RRR, clear S1 and S2, no murmurs, rubs, or gallops Respiratory: clear to auscultation bilaterally, no increased work of breathing GU: normal female external genitalia, cervix pink with reddened lesion at 6 oclock. Moderate amount of discharge present. No CMT. No adnexal masses appreciated Extremities: no edema or cyanosis. Right knee without effusion or swelling compared to left. Skin: warm and dry, no rashes noted Neuro: alert and oriented, no focal deficits  NJECTION: Patient was given informed consent, signed copy in the chart. Appropriate time out was taken. Area prepped and draped in usual sterile fashion. 1 cc of methylprednisolone 40 mg/ml plus  3 cc of 1% lidocaine without epinephrine was injected into the right knee using a(n) anterolateral approach. The patient tolerated the procedure well. There were no complications. Post procedure instructions were given. Assessment & Plan:    HYPERTENSION, BENIGN SYSTEMIC BP at goal today,  stable, continue current management. Refilled lisinopril HCTZ  Bilateral knee pain  Patient with arthritis in knees. Right knee injected in office today,patient tolerated this well. Continue naproxen as needed for pain. Discussed with patient other treatment options, patient prefers conservative measures at this time. Follow up as needed.   History of abnormal cervical Pap smear  ASC-US on last pap, we were unable to reach patient. Confirmed phone number in chart today. Repeated pap. Will discuss results with patient once they come back. Discussed with her if abnormal cells are persistent we would go for colposcopy next. Briefly explained this procedure and reason for it. WIll follow up based on results.  History of sexually transmitted disease  Trich on last pap. Patient did not get treatment. Repeat wet prep today and send g/c to lab. Will inform patient of results.  Preventative health care  Patient due for mammogram and colonoscopy. She would like to call to make these appointments herself. Gave information to reach breast center and various GI centers in area. Will place electronic referral for mammo.     Return if symptoms worsen or fail to improve.   Lucila Maine, DO Family Medicine Resident PGY-2

## 2017-03-28 NOTE — Patient Instructions (Addendum)
   It was great seeing you today!  We will call you with your pap smear results.  Please get your mammogram and colonoscopy done.  If you have questions or concerns please do not hesitate to call at [phone].  Lucila Maine, DO PGY-2, [facility] 03/28/2017 3:48 PM

## 2017-03-29 DIAGNOSIS — Z8619 Personal history of other infectious and parasitic diseases: Secondary | ICD-10-CM

## 2017-03-29 DIAGNOSIS — Z8742 Personal history of other diseases of the female genital tract: Secondary | ICD-10-CM | POA: Insufficient documentation

## 2017-03-29 HISTORY — DX: Personal history of other diseases of the female genital tract: Z87.42

## 2017-03-29 HISTORY — DX: Personal history of other infectious and parasitic diseases: Z86.19

## 2017-03-29 NOTE — Assessment & Plan Note (Signed)
  Patient with arthritis in knees. Right knee injected in office today,patient tolerated this well. Continue naproxen as needed for pain. Discussed with patient other treatment options, patient prefers conservative measures at this time. Follow up as needed.

## 2017-03-29 NOTE — Assessment & Plan Note (Signed)
  ASC-US on last pap, we were unable to reach patient. Confirmed phone number in chart today. Repeated pap. Will discuss results with patient once they come back. Discussed with her if abnormal cells are persistent we would go for colposcopy next. Briefly explained this procedure and reason for it. WIll follow up based on results.

## 2017-03-29 NOTE — Assessment & Plan Note (Signed)
  Patient due for mammogram and colonoscopy. She would like to call to make these appointments herself. Gave information to reach breast center and various GI centers in area. Will place electronic referral for mammo.

## 2017-03-29 NOTE — Assessment & Plan Note (Signed)
  Trich on last pap. Patient did not get treatment. Repeat wet prep today and send g/c to lab. Will inform patient of results.

## 2017-03-29 NOTE — Assessment & Plan Note (Signed)
BP at goal today, stable, continue current management. Refilled lisinopril HCTZ

## 2017-03-31 ENCOUNTER — Encounter: Payer: Self-pay | Admitting: Family Medicine

## 2017-03-31 ENCOUNTER — Ambulatory Visit: Payer: BLUE CROSS/BLUE SHIELD | Admitting: Internal Medicine

## 2017-03-31 LAB — CYTOLOGY - PAP
Chlamydia: NEGATIVE
Diagnosis: NEGATIVE
HPV: NOT DETECTED
Neisseria Gonorrhea: NEGATIVE

## 2017-08-10 ENCOUNTER — Other Ambulatory Visit: Payer: Self-pay

## 2017-08-10 NOTE — Telephone Encounter (Signed)
Amber Willis is calling to request refill of:  Name of Medication(s):  Naprosyn Last date of OV:  03/28/2017 Pharmacy:  [facility] route refill request to [employer].  Discussed with patient policy to call pharmacy for future refills.  Also, discussed refills may take up to 48 hours to approve or deny.  Ottis Stain

## 2017-08-11 MED ORDER — NAPROXEN 375 MG PO TABS: 375.0000 mg | ORAL_TABLET | Freq: Two times a day (BID) | ORAL | 2 refills | Status: DC

## 2017-12-18 ENCOUNTER — Telehealth: Payer: Self-pay | Admitting: *Deleted

## 2017-12-18 MED ORDER — PAROXETINE HCL 20 MG PO TABS: 20.0000 mg | ORAL_TABLET | Freq: Every day | ORAL | 0 refills | Status: DC

## 2017-12-18 MED ORDER — LISINOPRIL-HYDROCHLOROTHIAZIDE 10-12.5 MG PO TABS: 1.0000 | ORAL_TABLET | Freq: Every day | ORAL | 0 refills | Status: DC

## 2017-12-18 MED FILL — LISINOPRIL-HCTZ 10-12.5 MG: 10-12.5 | 30 days supply | Qty: 30 | Fill #0

## 2017-12-18 MED FILL — PARoxetine HCL 20 MG TABS: 20 | 30 days supply | Qty: 30 | Fill #0

## 2017-12-18 NOTE — Telephone Encounter (Signed)
Pt has misplaced her medications ( Paxil and lisinopril - HCTZ).  She is unable to pick up BP meds till 12/28/17 and the paxil she cant get until January.   Wants to know if there is anything we can do.  Advised that she cant pick up without paying whole amount.  Advised I would send message to MD to see if she has any suggestions.  Dr. Vanetta Shawl,  Can we send the BP meds separately this time and a diff dosage/sig of the Paxil (I.e 40mg  - take 1/2 tablet daily)?   Barney Russomanno, Salome Spotted, CMA

## 2017-12-18 NOTE — Telephone Encounter (Signed)
Contacted [facility] outpatient pharmacy.  Each med would cost $9 each.  OK given from [place] to send in.  Contacted pt to see if she can get to outpatient pharmacy.  LMOVM for callback.  Will wait for callback to send medication in. [place] Pacella, Salome Spotted, CMA

## 2017-12-18 NOTE — Telephone Encounter (Signed)
Im sorry, my message was not that clear.   She has misplaced the medications and her insurance will not cover until mid-December and early January.  She is unable to afford to pay out of pocket.  Come see me if it is still not clear  Williom Cedar, Salome Spotted, [place]

## 2017-12-18 NOTE — Telephone Encounter (Signed)
Oh I see.  Let me check with [facility] outpatient pharmacy about the indigent fund.  Ill get back to you. Edwina Grossberg, Salome Spotted, CMA

## 2017-12-18 NOTE — Telephone Encounter (Signed)
Pt returned call.  Informed that this would be a one time thing.  She is agreeable.  Rx sent to [facility] outpatient pharmacy. , Salome Spotted, CMA

## 2017-12-18 NOTE — Telephone Encounter (Signed)
I'm sorry, I'm at [facility] ED today. Your first message asked for 40 mg paxil tablets and take 1/2 tab? I can do that if she would be able to afford it. Unsure what "send in the BP medication separately" means. Could we use indigent fund to help her afford interim meds until insurance can cover? Happy to send in whatever would help her get her meds. Thanks

## 2017-12-18 NOTE — Telephone Encounter (Signed)
Can I just send in a new prescription for both? Or she cannot afford a full month supply of the medications? Sorry if I did not understand request

## 2018-01-01 DIAGNOSIS — E079 Disorder of thyroid, unspecified: Secondary | ICD-10-CM | POA: Diagnosis not present

## 2018-01-01 DIAGNOSIS — M25561 Pain in right knee: Secondary | ICD-10-CM | POA: Diagnosis present

## 2018-01-01 DIAGNOSIS — M1711 Unilateral primary osteoarthritis, right knee: Secondary | ICD-10-CM | POA: Diagnosis not present

## 2018-01-01 DIAGNOSIS — I10 Essential (primary) hypertension: Secondary | ICD-10-CM | POA: Diagnosis not present

## 2018-01-02 ENCOUNTER — Emergency Department (HOSPITAL_COMMUNITY): Payer: BLUE CROSS/BLUE SHIELD

## 2018-01-02 ENCOUNTER — Encounter: Payer: Self-pay | Admitting: Emergency Medicine

## 2018-01-02 ENCOUNTER — Emergency Department (HOSPITAL_COMMUNITY)
Admission: EM | Admit: 2018-01-02 | Discharge: 2018-01-02 | Disposition: A | Discharge status: Home / Self Care | Payer: BLUE CROSS/BLUE SHIELD | Attending: Emergency Medicine | Admitting: Emergency Medicine

## 2018-01-02 DIAGNOSIS — M1711 Unilateral primary osteoarthritis, right knee: Secondary | ICD-10-CM

## 2018-01-02 LAB — CBC WITH DIFFERENTIAL/PLATELET
Abs Immature Granulocytes: 0.04 10*3/uL (ref 0.00–0.07)
Basophils Absolute: 0 10*3/uL (ref 0.0–0.1)
Basophils Relative: 0 %
Eosinophils Absolute: 0 10*3/uL (ref 0.0–0.5)
Eosinophils Relative: 0 %
HCT: 38.8 % (ref 36.0–46.0)
Hemoglobin: 12.2 g/dL (ref 12.0–15.0)
Immature Granulocytes: 0 %
LYMPHS ABS: 1.3 10*3/uL (ref 0.7–4.0)
Lymphocytes Relative: 14 %
MCH: 28.2 pg (ref 26.0–34.0)
MCHC: 31.4 g/dL (ref 30.0–36.0)
MCV: 89.6 fL (ref 80.0–100.0)
Monocytes Absolute: 0.5 10*3/uL (ref 0.1–1.0)
Monocytes Relative: 5 %
Neutro Abs: 7.3 10*3/uL (ref 1.7–7.7)
Neutrophils Relative %: 81 %
Platelets: 236 10*3/uL (ref 150–400)
RBC: 4.33 MIL/uL (ref 3.87–5.11)
RDW: 13.8 % (ref 11.5–15.5)
WBC: 9.2 10*3/uL (ref 4.0–10.5)
nRBC: 0 % (ref 0.0–0.2)

## 2018-01-02 LAB — I-STAT CHEM 8, ED
BUN: 12 mg/dL (ref 6–20)
Calcium, Ion: 1.08 mmol/L — ABNORMAL LOW (ref 1.15–1.40)
Chloride: 104 mmol/L (ref 98–111)
Creatinine, Ser: 0.6 mg/dL (ref 0.44–1.00)
GLUCOSE: 129 mg/dL — AB (ref 70–99)
HCT: 41 % (ref 36.0–46.0)
Hemoglobin: 13.9 g/dL (ref 12.0–15.0)
Potassium: 4.2 mmol/L (ref 3.5–5.1)
Sodium: 139 mmol/L (ref 135–145)
TCO2: 30 mmol/L (ref 22–32)

## 2018-01-02 MED ORDER — METHOCARBAMOL 750 MG PO TABS: 750.0000 mg | ORAL_TABLET | Freq: Four times a day (QID) | ORAL | 0 refills | Status: DC

## 2018-01-02 MED ORDER — DIAZEPAM 5 MG PO TABS
5.0000 mg | ORAL_TABLET | Freq: Once | ORAL | Status: AC
  Administered 2018-01-02: 5 mg via ORAL
  Filled 2018-01-02: qty 1

## 2018-01-02 MED ORDER — HYDROCODONE-ACETAMINOPHEN 5-325 MG PO TABS: 1.0000 | ORAL_TABLET | ORAL | 0 refills | Status: DC | PRN

## 2018-01-02 MED ORDER — KETOROLAC TROMETHAMINE 60 MG/2ML IM SOLN
60.0000 mg | Freq: Once | INTRAMUSCULAR | Status: AC
  Administered 2018-01-02: 60 mg via INTRAMUSCULAR
  Filled 2018-01-02: qty 2

## 2018-01-02 MED ORDER — LORAZEPAM 2 MG/ML IJ SOLN: 1.0000 mg | Freq: Once | INTRAMUSCULAR | Status: DC

## 2018-01-02 MED ORDER — PREDNISONE 10 MG (21) PO TBPK: ORAL_TABLET | ORAL | 0 refills | Status: DC

## 2018-01-02 MED ORDER — IOPAMIDOL (ISOVUE-300) INJECTION 61%
INTRAVENOUS | Status: AC
  Filled 2018-01-02: qty 100

## 2018-01-02 MED ORDER — SODIUM CHLORIDE (PF) 0.9 % IJ SOLN
INTRAMUSCULAR | Status: AC
  Filled 2018-01-02: qty 50

## 2018-01-02 MED ORDER — SODIUM CHLORIDE 0.9 % IV SOLN
INTRAVENOUS | Status: DC
  Administered 2018-01-02: 05:00:00 via INTRAVENOUS

## 2018-01-02 MED ORDER — OXYCODONE-ACETAMINOPHEN 5-325 MG PO TABS
2.0000 | ORAL_TABLET | Freq: Once | ORAL | Status: AC
  Administered 2018-01-02: 2 via ORAL
  Filled 2018-01-02: qty 2

## 2018-01-02 MED ORDER — IOPAMIDOL (ISOVUE-300) INJECTION 61%
100.0000 mL | Freq: Once | INTRAVENOUS | Status: AC | PRN
  Administered 2018-01-02: 100 mL via INTRAVENOUS

## 2018-01-02 NOTE — ED Provider Notes (Signed)
  Physical Exam  BP (!) 164/90   Pulse (!) 57   Temp 99 F (37.2 C) (Oral)   Resp 16   Ht 5\' 3"  (1.6 m)   Wt 87.5 kg   LMP 01/17/2017   SpO2 99%   BMI 34.19 kg/m   Physical Exam  ED Course/Procedures     Procedures  MDM  Pt signed out by Dr. Zenia Resides pending CT scan of RLE.    IMPRESSION:  No acute abnormality or finding to explain the patient's symptoms.    Osteoarthritis right knee is severe in the patellofemoral  compartment.      Pt is feeling much better.  She will be placed in a knee immobilizer and instructed to f/u with ortho.    Isla Pence, MD 01/02/18 [phone]

## 2018-01-02 NOTE — ED Provider Notes (Addendum)
[facility] DEPT Provider Note   CSN: [id number] Arrival date & time: 01/01/18  2314     History   Chief Complaint Chief Complaint  Patient presents with  . Leg Pain    HPI Amber Willis is a 57 y.o. female.  57 year old lady presents with pain to her right thigh and right calf.  Works [employer] and does lots of standing.  Denies any shortness of breath or swelling.  Pain is characterized as sharp and worse with standing and movement and better with remaining still.  Denies any history of trauma.  Has used Tylenol without relief.     Past Medical History:  Diagnosis Date  . Abnormal TSH    But normal T4.   . Depression   . Flat foot(734)   . Hyperlipidemia   . Hypertension   . Thyroid disease     Patient Active Problem List   Diagnosis Date Noted  . History of abnormal cervical Pap smear 03/29/2017  . History of sexually transmitted disease 03/29/2017  . Bilateral knee pain 01/01/2015  . Preventative health care 04/03/2012  . Hyperlipidemia 11/18/2008  . MAMMOGRAM, ABNORMAL, RIGHT 05/20/2008  . OBESITY, NOS 03/16/2006  . Major depressive disorder, recurrent episode ([place]) 03/16/2006  . HYPERTENSION, BENIGN SYSTEMIC 03/16/2006  . MENOPAUSAL SYNDROME 03/16/2006    History reviewed. No pertinent surgical history.   OB History   No obstetric history on file.      Home Medications    Prior to Admission medications   Medication Sig Start Date End Date Taking? Authorizing Provider  atorvastatin (LIPITOR) 40 MG tablet Take 1 tablet (40 mg total) by mouth daily. 03/28/17 03/28/18  Steve Rattler, DO  lisinopril-hydrochlorothiazide (PRINZIDE,ZESTORETIC) 10-12.5 MG tablet Take 1 tablet by mouth daily. Please provide from [place] 12/18/17   Steve Rattler, DO  naproxen (NAPROSYN) 375 MG tablet Take 1 tablet (375 mg total) by mouth 2 (two) times daily. 08/11/17   Steve Rattler, DO  PARoxetine (PAXIL) 20 MG  tablet Take 1 tablet (20 mg total) by mouth daily. Please provide from [facility] 12/18/17   Steve Rattler, DO    Family History History reviewed. No pertinent family history.  Social History Social History   Tobacco Use  . Smoking status: Never Smoker  . Smokeless tobacco: Never Used  Substance Use Topics  . Alcohol use: No  . Drug use: No     Allergies   Patient has no known allergies.   Review of Systems Review of Systems  All other systems reviewed and are negative.    Physical Exam Updated Vital Signs BP (!) 117/100   Pulse 64   Temp 99 F (37.2 C) (Oral)   Resp 16   Ht 1.6 m (5\' 3" )   Wt 87.5 kg   LMP 01/17/2017   SpO2 99%   BMI 34.19 kg/m   Physical Exam Vitals signs and nursing note reviewed.  Constitutional:      General: She is not in acute distress.    Appearance: Normal appearance. She is well-developed. She is not toxic-appearing.  HENT:     Head: Normocephalic and atraumatic.  Eyes:     General: Lids are normal.     Conjunctiva/sclera: Conjunctivae normal.     Pupils: Pupils are equal, round, and reactive to light.  Neck:     Musculoskeletal: Normal range of motion and neck supple.     Thyroid: No thyroid  mass.     Trachea: No tracheal deviation.  Cardiovascular:     Rate and Rhythm: Normal rate and regular rhythm.     Heart sounds: Normal heart sounds. No murmur. No gallop.   Pulmonary:     Effort: Pulmonary effort is normal. No respiratory distress.     Breath sounds: Normal breath sounds. No stridor. No decreased breath sounds, wheezing, rhonchi or rales.  Abdominal:     General: Bowel sounds are normal. There is no distension.     Palpations: Abdomen is soft.     Tenderness: There is no abdominal tenderness. There is no rebound.  Musculoskeletal: Normal range of motion.        General: No tenderness.       Legs:     Comments: Neurovascular intact at right foot.  Compartment soft right thigh and right  calf.  Skin:    General: Skin is warm and dry.     Findings: No abrasion or rash.  Neurological:     Mental Status: She is alert and oriented to person, place, and time.     GCS: GCS eye subscore is 4. GCS verbal subscore is 5. GCS motor subscore is 6.     Cranial Nerves: No cranial nerve deficit.     Sensory: No sensory deficit.  Psychiatric:        Speech: Speech normal.        Behavior: Behavior normal.      ED Treatments / Results  Labs (all labs ordered are listed, but only abnormal results are displayed) Labs Reviewed - No data to display  EKG None  Radiology No results found.  Procedures Procedures (including critical care time)  Medications Ordered in ED Medications - No data to display   Initial Impression / Assessment and Plan / ED Course  I have reviewed the triage vital signs and the nursing notes.  Pertinent labs & imaging results that were available during my care of the patient were reviewed by me and considered in my medical decision making (see chart for details).     Patient's compartments are soft.   Suspect musculoskeletal strain and will treat  4:28 AM Nursing attempted to discharge patient patient had trouble ambulating.  Patient reexamined and tender at distal posterior thigh.  Will order CT.  7:27 AM CT of extremity pending at this time.  Care turned over to Dr. Gilford Raid  Final Clinical Impressions(s) / ED Diagnoses   Final diagnoses:  None    ED Discharge Orders    None       Lacretia Leigh, MD 01/02/18 [id number]    Lacretia Leigh, MD 01/02/18 [id number]

## 2018-01-02 NOTE — ED Triage Notes (Signed)
Pt complains of right upper leg pain since this am, no injury noted and she states that the pain shoots down her leg

## 2018-01-02 NOTE — ED Notes (Signed)
Attempted to ambulate pt with assistance. Pt stated she was unable to walk due to the sharp pain in her leg.

## 2018-01-02 NOTE — ED Notes (Signed)
Pt unable to ambulate, 2x assist.

## 2018-01-02 NOTE — ED Notes (Signed)
Attempted to ambulate pt with walker.  Pt able to stand but incapable of standing up straight and only able to take two steps before she is unable to tolerate standing.  Per pt she has not had symptoms like this before and is normally able to stand, walk, work without difficulty.

## 2018-01-02 NOTE — ED Notes (Signed)
Pt now able to ambulate without assistance

## 2018-01-02 NOTE — Progress Notes (Signed)
Orthopedic Tech Progress Note Patient Details:  Amber Willis 1960-08-10 [id number] Pt can not use crutches. Patient ID: [name], female   DOB: [birth year], 57 y.o.   MRN: [id number]   [name] [facility] 01/02/2018, 10:20 AM

## 2018-01-02 NOTE — ED Notes (Signed)
2 RN's attempted to walk with this pt but pt unable to tolerate standing.  MD aware.  Medication order pending.

## 2018-01-02 NOTE — ED Notes (Signed)
PT called for a trial walker

## 2018-01-02 NOTE — ED Notes (Signed)
Ortho called for knee immobilizer

## 2018-01-29 ENCOUNTER — Other Ambulatory Visit: Payer: Self-pay

## 2018-01-29 NOTE — Telephone Encounter (Signed)
Patient calling back about this again on voicemail.  Danley Danker, RN [facility] RN)

## 2018-01-30 MED ORDER — PAROXETINE HCL 20 MG PO TABS: 20.0000 mg | ORAL_TABLET | Freq: Every day | ORAL | 2 refills | Status: DC

## 2018-04-17 ENCOUNTER — Other Ambulatory Visit: Payer: Self-pay

## 2018-04-17 NOTE — Telephone Encounter (Signed)
Pt called nurse line stating she would like a refill on naproxen for her continued knee pain. Please advise.

## 2018-04-18 MED ORDER — NAPROXEN 375 MG PO TABS: 375.0000 mg | ORAL_TABLET | Freq: Two times a day (BID) | ORAL | 2 refills | Status: DC

## 2018-05-19 ENCOUNTER — Other Ambulatory Visit: Payer: Self-pay | Admitting: Family Medicine

## 2018-05-22 NOTE — Telephone Encounter (Signed)
Patient calls to check status because she is out of medication.  Amber Willis, CMA

## 2018-08-09 ENCOUNTER — Other Ambulatory Visit: Payer: Self-pay | Admitting: Family Medicine

## 2018-08-09 DIAGNOSIS — Z1231 Encounter for screening mammogram for malignant neoplasm of breast: Secondary | ICD-10-CM

## 2018-08-29 ENCOUNTER — Ambulatory Visit: Payer: BLUE CROSS/BLUE SHIELD | Admitting: Family Medicine

## 2018-09-14 ENCOUNTER — Other Ambulatory Visit: Payer: Self-pay

## 2018-09-14 MED ORDER — PAROXETINE HCL 20 MG PO TABS: 20.0000 mg | ORAL_TABLET | Freq: Every day | ORAL | 2 refills | Status: DC

## 2018-09-26 ENCOUNTER — Ambulatory Visit: Payer: BLUE CROSS/BLUE SHIELD

## 2018-11-06 ENCOUNTER — Ambulatory Visit
Admission: RE | Admit: 2018-11-06 | Discharge: 2018-11-06 | Disposition: A | Discharge status: Home / Self Care | Payer: BLUE CROSS/BLUE SHIELD | Source: Ambulatory Visit | Attending: Family Medicine | Admitting: Family Medicine

## 2018-11-06 ENCOUNTER — Other Ambulatory Visit: Payer: Self-pay

## 2018-11-06 DIAGNOSIS — Z1231 Encounter for screening mammogram for malignant neoplasm of breast: Secondary | ICD-10-CM

## 2019-01-29 ENCOUNTER — Other Ambulatory Visit: Payer: Self-pay | Admitting: Family Medicine

## 2019-01-31 NOTE — Telephone Encounter (Signed)
2nd request from pharmacy. Valory Wetherby,CMA

## 2019-02-04 ENCOUNTER — Other Ambulatory Visit: Payer: Self-pay | Admitting: Family Medicine

## 2019-02-04 ENCOUNTER — Other Ambulatory Visit: Payer: Self-pay

## 2019-02-04 NOTE — Telephone Encounter (Signed)
Pt calls nurse line stating that she lost her recent refill of paroxetine. Pt requests additional refill.  To PCP Talbot Grumbling, RN

## 2019-02-04 NOTE — Telephone Encounter (Signed)
Transmission to pharmacy failed on 1/14 rx. Verbal given to pharmacist.

## 2019-02-14 ENCOUNTER — Ambulatory Visit: Payer: BLUE CROSS/BLUE SHIELD | Admitting: Family Medicine

## 2019-03-15 ENCOUNTER — Other Ambulatory Visit: Payer: Self-pay | Admitting: Family Medicine

## 2019-05-06 ENCOUNTER — Other Ambulatory Visit: Payer: Self-pay

## 2019-05-06 ENCOUNTER — Ambulatory Visit (INDEPENDENT_AMBULATORY_CARE_PROVIDER_SITE_OTHER): Payer: 59 | Admitting: Family Medicine

## 2019-05-06 ENCOUNTER — Encounter: Payer: Self-pay | Admitting: Family Medicine

## 2019-05-06 VITALS — BP 124/68 | HR 71 | Ht 63.0 in | Wt 199.0 lb

## 2019-05-06 DIAGNOSIS — I10 Essential (primary) hypertension: Secondary | ICD-10-CM | POA: Diagnosis not present

## 2019-05-06 DIAGNOSIS — Z1211 Encounter for screening for malignant neoplasm of colon: Secondary | ICD-10-CM

## 2019-05-06 DIAGNOSIS — Z1159 Encounter for screening for other viral diseases: Secondary | ICD-10-CM | POA: Diagnosis not present

## 2019-05-06 DIAGNOSIS — Z0000 Encounter for general adult medical examination without abnormal findings: Secondary | ICD-10-CM | POA: Diagnosis not present

## 2019-05-06 DIAGNOSIS — M25561 Pain in right knee: Secondary | ICD-10-CM

## 2019-05-06 DIAGNOSIS — M25562 Pain in left knee: Secondary | ICD-10-CM

## 2019-05-06 DIAGNOSIS — E785 Hyperlipidemia, unspecified: Secondary | ICD-10-CM | POA: Diagnosis not present

## 2019-05-06 DIAGNOSIS — F331 Major depressive disorder, recurrent, moderate: Secondary | ICD-10-CM

## 2019-05-06 DIAGNOSIS — R7303 Prediabetes: Secondary | ICD-10-CM

## 2019-05-06 DIAGNOSIS — Z1231 Encounter for screening mammogram for malignant neoplasm of breast: Secondary | ICD-10-CM

## 2019-05-06 DIAGNOSIS — G8929 Other chronic pain: Secondary | ICD-10-CM

## 2019-05-06 DIAGNOSIS — E669 Obesity, unspecified: Secondary | ICD-10-CM

## 2019-05-06 LAB — POCT GLYCOSYLATED HEMOGLOBIN (HGB A1C): HbA1c, POC (prediabetic range): 5.9 % (ref 5.7–6.4)

## 2019-05-06 MED ORDER — DICLOFENAC POTASSIUM 50 MG PO TABS: 50.0000 mg | ORAL_TABLET | Freq: Two times a day (BID) | ORAL | 1 refills | Status: DC

## 2019-05-06 MED ORDER — LISINOPRIL-HYDROCHLOROTHIAZIDE 10-12.5 MG PO TABS: 1.0000 | ORAL_TABLET | Freq: Every day | ORAL | 3 refills | Status: DC

## 2019-05-06 MED ORDER — TIZANIDINE HCL 2 MG PO TABS: 2.0000 mg | ORAL_TABLET | Freq: Three times a day (TID) | ORAL | 0 refills | Status: DC | PRN

## 2019-05-06 MED ORDER — ATORVASTATIN CALCIUM 40 MG PO TABS: 40.0000 mg | ORAL_TABLET | Freq: Every day | ORAL | 3 refills | Status: DC

## 2019-05-06 MED ORDER — PAROXETINE HCL 20 MG PO TABS: 20.0000 mg | ORAL_TABLET | Freq: Every day | ORAL | 2 refills | Status: DC

## 2019-05-06 NOTE — Patient Instructions (Addendum)
It was great seeing you today!   I'd like to see you back in 6 months  but if you need to be seen earlier than that for any new issues we're happy to fit you in, just give [place] a call!  - Stop by the pharmacy to pick up your prescriptions.  -  You should receive a call to schedule your colonoscopy and mammogram.  If no one calls you please call them or call us!   If you have questions or concerns please do not hesitate to call at [phone].  Dr. [facility]

## 2019-05-06 NOTE — Progress Notes (Addendum)
    SUBJECTIVE:   CHIEF COMPLAINT / HPI:   CC: high blood pressure and knee pain   Amber Willis is a 59 y.o. female here for follow up.     OA  Bilateral knee pain is ongoing. More right than left.  Pain described as throbbing. Reports hurts worse when it rains. Takes naprosyn and tylenol with relief.  Does not use walking assistive devices.     HTN Takes lisinopril-HCTZ. Denies missing doses of antihypertensive medications. Denies chest pain, palpitations, lower extremity edema, exertional dyspnea, headaches and vision changes.    MDD Taking Paroxetine.  Feels fatigued often.   HLD Patient not taking Lipitor.     PERTINENT  PMH / PSH: HTN, HLD, obesity, OA, MDD   OBJECTIVE:   BP 124/68   Pulse 71   Ht 5\' 3"  (1.6 m)   Wt 199 lb (90.3 kg)   LMP 01/17/2017   SpO2 100%   BMI 35.25 kg/m    GEN: pleasant well appearing female, in no acute distress  CV: regular rate and rhythm, no murmurs appreciated  RESP: no increased work of breathing, clear to ascultation bilaterally ABD: Bowel sounds present. Soft, Nontender, Nondistended.  MSK: no edema, bilateral knees non-tender to palpation, palpable DP pulses  SKIN: warm, dry NEURO: normal tone, moves all extremities appropriately PSYCH: Normal affect and thought content   ASSESSMENT/PLAN:   HYPERTENSION, BENIGN SYSTEMIC Stable. BP at goal. Continue current medications. - Refill lisinopril-HCTZ 10mg  -12.5mg   - Follow up on CMP, a1c and lipid panel   Hyperlipidemia Patient not compliant with Lipitor. Discussed the need to restart this medication with patient.  Patient agreeable. ASCVD risk 6.1-13.9% depending on inclusion of prediabetes. LDL 136 - recheck LDL in 3 months  - Lipitor 40 mg daily     Major depressive disorder, recurrent episode (HCC) Stable. Refilled Paroxetine.   Depression screen [facility] 2/9 05/06/2019 03/28/2017 03/23/2016 12/29/2014  Decreased Interest 2 0 0 0  Down, Depressed, Hopeless 2 0 0 0  PHQ  - 2 Score 4 0 0 0  Altered sleeping 1 - - -  Tired, decreased energy 2 - - -  Change in appetite 0 - - -  Feeling bad or failure about yourself  0 - - -  Trouble concentrating 1 - - -  Moving slowly or fidgety/restless 2 - - -  Suicidal thoughts 0 - - -  PHQ-9 Score 10 - - -  Difficult doing work/chores Not difficult at all - - -   -follow up CBC, as patient reported ongoing fatigue   Bilateral knee pain Continue Naprosyn and tylenol. Start Tizanidine as insurance does not cover Robaxin.  - Refill sent  - If not improving consider joint injection   Prediabetes A1c 5.9 today.  Patient with family hx of T2DM (mom and sister).  Encouraged weight loss via healthy eating habits and exercise.   - Consider starting metformin at next visit if a1c continues to be elevated   Preventative health care Patient due for tetanus but is scheduled for COVID vaccine. Need for mammogram and colonoscopy discussed with patient and referrals placed. Gave patient information for breast center.  -Follow up [name]

## 2019-05-07 LAB — CBC
Hematocrit: 36.6 % (ref 34.0–46.6)
Hemoglobin: 11.9 g/dL (ref 11.1–15.9)
MCH: 28 pg (ref 26.6–33.0)
MCHC: 32.5 g/dL (ref 31.5–35.7)
MCV: 86 fL (ref 79–97)
Platelets: 253 10*3/uL (ref 150–450)
RBC: 4.25 x10E6/uL (ref 3.77–5.28)
RDW: 13.2 % (ref 11.7–15.4)
WBC: 7.6 10*3/uL (ref 3.4–10.8)

## 2019-05-07 LAB — COMPREHENSIVE METABOLIC PANEL
ALT: 9 IU/L (ref 0–32)
AST: 13 IU/L (ref 0–40)
Albumin/Globulin Ratio: 1.4 (ref 1.2–2.2)
Albumin: 4.2 g/dL (ref 3.8–4.9)
Alkaline Phosphatase: 90 IU/L (ref 39–117)
BUN/Creatinine Ratio: 12 (ref 9–23)
BUN: 10 mg/dL (ref 6–24)
Bilirubin Total: 0.3 mg/dL (ref 0.0–1.2)
CO2: 25 mmol/L (ref 20–29)
Calcium: 9.2 mg/dL (ref 8.7–10.2)
Chloride: 103 mmol/L (ref 96–106)
Creatinine, Ser: 0.83 mg/dL (ref 0.57–1.00)
GFR calc Af Amer: 90 mL/min/{1.73_m2} (ref >59)
GFR calc non Af Amer: 78 mL/min/{1.73_m2} (ref >59)
Globulin, Total: 3.1 g/dL (ref 1.5–4.5)
Glucose: 114 mg/dL — ABNORMAL HIGH (ref 65–99)
Potassium: 4 mmol/L (ref 3.5–5.2)
Sodium: 142 mmol/L (ref 134–144)
Total Protein: 7.3 g/dL (ref 6.0–8.5)

## 2019-05-07 LAB — LIPID PANEL
Chol/HDL Ratio: 4.3 ratio (ref 0.0–4.4)
Cholesterol, Total: 197 mg/dL (ref 100–199)
HDL: 46 mg/dL (ref >39)
LDL Chol Calc (NIH): 136 mg/dL — ABNORMAL HIGH (ref 0–99)
Triglycerides: 83 mg/dL (ref 0–149)
VLDL Cholesterol Cal: 15 mg/dL (ref 5–40)

## 2019-05-07 LAB — HCV INTERPRETATION

## 2019-05-07 LAB — HCV AB W REFLEX TO QUANT PCR: HCV Ab: <0.1 s/co ratio (ref 0.0–0.9)

## 2019-05-09 DIAGNOSIS — E119 Type 2 diabetes mellitus without complications: Secondary | ICD-10-CM | POA: Insufficient documentation

## 2019-05-09 DIAGNOSIS — R7303 Prediabetes: Secondary | ICD-10-CM | POA: Insufficient documentation

## 2019-05-09 NOTE — Assessment & Plan Note (Addendum)
Patient not compliant with Lipitor. Discussed the need to restart this medication with patient.  Patient agreeable. ASCVD risk 6.1-13.9% depending on inclusion of prediabetes. LDL 136 - recheck LDL in 3 months  - Lipitor 40 mg daily

## 2019-05-09 NOTE — Assessment & Plan Note (Addendum)
A1c 5.9 today.  Patient with family hx of T2DM (mom and sister).  Encouraged weight loss via healthy eating habits and exercise.   - Consider starting metformin at next visit if a1c continues to be elevated

## 2019-05-09 NOTE — Assessment & Plan Note (Addendum)
Continue Naprosyn and tylenol. Start Tizanidine as insurance does not cover Robaxin.  - Refill sent  - If not improving consider joint injection

## 2019-05-09 NOTE — Assessment & Plan Note (Signed)
Stable. BP at goal. Continue current medications. - Refill lisinopril-HCTZ 10mg  -12.5mg   - Follow up on CMP, a1c and lipid panel

## 2019-05-09 NOTE — Assessment & Plan Note (Addendum)
Stable. Refilled Paroxetine.   Depression screen [facility] 2/9 05/06/2019 03/28/2017 03/23/2016 12/29/2014  Decreased Interest 2 0 0 0  Down, Depressed, Hopeless 2 0 0 0  PHQ - 2 Score 4 0 0 0  Altered sleeping 1 - - -  Tired, decreased energy 2 - - -  Change in appetite 0 - - -  Feeling bad or failure about yourself  0 - - -  Trouble concentrating 1 - - -  Moving slowly or fidgety/restless 2 - - -  Suicidal thoughts 0 - - -  PHQ-9 Score 10 - - -  Difficult doing work/chores Not difficult at all - - -   -follow up CBC, as patient reported ongoing fatigue

## 2019-05-09 NOTE — Assessment & Plan Note (Addendum)
Patient due for tetanus but is scheduled for COVID vaccine. Need for mammogram and colonoscopy discussed with patient and referrals placed. Gave patient information for breast center.  -Follow up Hep C

## 2019-07-23 ENCOUNTER — Other Ambulatory Visit: Payer: Self-pay

## 2019-07-23 ENCOUNTER — Ambulatory Visit
Admission: RE | Admit: 2019-07-23 | Discharge: 2019-07-23 | Disposition: A | Discharge status: Home / Self Care | Payer: 59 | Source: Ambulatory Visit | Attending: Family Medicine | Admitting: Family Medicine

## 2019-07-23 DIAGNOSIS — Z1231 Encounter for screening mammogram for malignant neoplasm of breast: Secondary | ICD-10-CM

## 2019-11-05 ENCOUNTER — Ambulatory Visit: Payer: 59 | Admitting: Family Medicine

## 2019-11-25 ENCOUNTER — Encounter: Payer: Self-pay | Admitting: Family Medicine

## 2019-11-25 ENCOUNTER — Ambulatory Visit (INDEPENDENT_AMBULATORY_CARE_PROVIDER_SITE_OTHER): Payer: 59 | Admitting: Family Medicine

## 2019-11-25 ENCOUNTER — Other Ambulatory Visit: Payer: Self-pay

## 2019-11-25 DIAGNOSIS — F331 Major depressive disorder, recurrent, moderate: Secondary | ICD-10-CM | POA: Diagnosis not present

## 2019-11-25 DIAGNOSIS — E785 Hyperlipidemia, unspecified: Secondary | ICD-10-CM

## 2019-11-25 DIAGNOSIS — M25562 Pain in left knee: Secondary | ICD-10-CM

## 2019-11-25 DIAGNOSIS — M25561 Pain in right knee: Secondary | ICD-10-CM

## 2019-11-25 DIAGNOSIS — I10 Essential (primary) hypertension: Secondary | ICD-10-CM

## 2019-11-25 DIAGNOSIS — G8929 Other chronic pain: Secondary | ICD-10-CM

## 2019-11-25 MED ORDER — TIZANIDINE HCL 2 MG PO TABS: 2.0000 mg | ORAL_TABLET | Freq: Three times a day (TID) | ORAL | 0 refills | Status: DC | PRN

## 2019-11-25 MED ORDER — ATORVASTATIN CALCIUM 40 MG PO TABS: 40.0000 mg | ORAL_TABLET | Freq: Every day | ORAL | 3 refills | Status: DC

## 2019-11-25 MED ORDER — LISINOPRIL-HYDROCHLOROTHIAZIDE 10-12.5 MG PO TABS: 1.0000 | ORAL_TABLET | Freq: Every day | ORAL | 3 refills | Status: DC

## 2019-11-25 MED ORDER — DICLOFENAC POTASSIUM 50 MG PO TABS: 50.0000 mg | ORAL_TABLET | Freq: Two times a day (BID) | ORAL | 1 refills | Status: DC

## 2019-11-25 MED ORDER — PAROXETINE HCL 20 MG PO TABS: 20.0000 mg | ORAL_TABLET | Freq: Every day | ORAL | 0 refills | Status: DC

## 2019-11-25 NOTE — Progress Notes (Signed)
   SUBJECTIVE:   CHIEF COMPLAINT / HPI:   Chief Complaint  Patient presents with  . Hypertension     Amber Willis is a 59 y.o. female here for follow up.   HTN Takes HCTZ/Lisinopril. Denies missing doses of antihypertensive medications. Denies chest pain, palpitations, lower extremity edema, exertional dyspnea, lightheadedness, headaches and vision changes.  HLD Has not been taking Crestor as she needs a refill.   Forgetfulness Patient reports walking into rooms and forgetting what she went into the room for. States this occurs maybe once a twice a week. No weakness, headaches or confusion.      PERTINENT  PMH / PSH: reviewed and updated as appropriate   OBJECTIVE:   BP 118/72   Pulse 61   Ht 5\' 3"  (1.6 m)   Wt 207 lb 6.4 oz (94.1 kg)   LMP 01/17/2017   SpO2 98%   BMI 36.74 kg/m    GEN: pleasant older female, in no acute distress CV: regular rate and rhythm, no murmurs appreciated RESP: no increased work of breathing, clear to ascultation bilaterally MSK: no LE edema, or calf tenderness  SKIN: warm, dry NEURO: oriented x4, normal speech, moves all extremities appropriately PSYCH: Normal affect, appropriate speech and behavior    ASSESSMENT/PLAN:   HYPERTENSION, BENIGN SYSTEMIC Stable. BP at [facility]. Continue HCTZ/Lisinopril. Reviewed previous lipid panel, a1c and cmp  - Refilled medication  Hyperlipidemia The 10-year ASCVD risk score Amber Bussing DC Jr., et al., 2013) is: 5.3%   Values used to calculate the score:     Age: 80 years     Sex: Female     Is Non-Hispanic African American: Yes     Diabetic: No     Tobacco smoker: No     Systolic Blood Pressure: 706 mmHg     Is BP treated: Yes     HDL Cholesterol: 46 mg/dL     Total Cholesterol: 197 mg/dL  Refilled Lipitor. Recheck in 3 months.   Major depressive disorder, recurrent episode ([place]) Patient taking Paxil.  Refill sent to pharmacy. Patient not wanting to change medication at this time. Therapy  offered but declined. Consider psuedodementia 2/2 to depression as she also reports increased forgetfulness.   Depression screen [facility] 2/9 11/25/2019 05/06/2019 03/28/2017  Decreased Interest 2 2 0  Down, Depressed, Hopeless 0 2 0  PHQ - 2 Score 2 4 0  Altered sleeping 3 1 -  Tired, decreased energy 3 2 -  Change in appetite 2 0 -  Feeling bad or failure about yourself  2 0 -  Trouble concentrating 2 1 -  Moving slowly or fidgety/restless 3 2 -  Suicidal thoughts 0 0 -  PHQ-9 Score 17 10 -  Difficult doing work/chores Somewhat difficult Not difficult at all -     Forgetfulness  MMSE score 28/30. Continue to monitor. Consider pseudodementia.     Amber Hensen, DO PGY-2, [facility] 11/25/2019

## 2019-11-25 NOTE — Patient Instructions (Signed)
It was great seeing you today!  Please check-out at the front desk before leaving the clinic. I'd like to see you back in 3 months but if you need to be seen earlier than that for any new issues we're happy to fit you in, just give [place] a call!  Visit Remembers: - Stop by the pharmacy to pick up your prescriptions  - Continue to work on your healthy eating habits and incorporating exercise into your daily life.  - Your goal is to have an BP < 120/80 - Medicine Changes: Restart your cholesterol medication  - To Do: Call Phone: [phone] to schedule your colonoscopy  Regarding lab work today:  Due to recent changes in healthcare laws, you may see the results of your imaging and laboratory studies on MyChart before your provider has had a chance to review them.  I understand that in some cases there may be results that are confusing or concerning to you. Not all laboratory results come back in the same time frame and you may be waiting for multiple results in order to interpret others.  Please give [place] 72 hours in order for your provider to thoroughly review all the results before contacting the office for clarification of your results. If everything is normal, you will get a letter in the mail or a message in My Chart. Please give [place] a call if you do not hear from [place] after 2 weeks.  Please bring all of your medications with you to each visit.    If you haven't already, sign up for My Chart to have easy access to your labs results, and communication with your primary care physician.  Feel free to call with any questions or concerns at any time, at [phone].   Take care,  Dr. [facility]

## 2019-11-27 NOTE — Assessment & Plan Note (Signed)
Patient taking Paxil.  Refill sent to pharmacy. Patient not wanting to change medication at this time. Therapy offered but declined. Consider psuedodementia 2/2 to depression as she also reports increased forgetfulness.   Depression screen [facility] 2/9 11/25/2019 05/06/2019 03/28/2017  Decreased Interest 2 2 0  Down, Depressed, Hopeless 0 2 0  PHQ - 2 Score 2 4 0  Altered sleeping 3 1 -  Tired, decreased energy 3 2 -  Change in appetite 2 0 -  Feeling bad or failure about yourself  2 0 -  Trouble concentrating 2 1 -  Moving slowly or fidgety/restless 3 2 -  Suicidal thoughts 0 0 -  PHQ-9 Score 17 10 -  Difficult doing work/chores Somewhat difficult Not difficult at all -

## 2019-11-27 NOTE — Assessment & Plan Note (Signed)
Stable. BP at [facility]. Continue HCTZ/Lisinopril. Reviewed previous lipid panel, a1c and cmp  - Refilled medication

## 2019-11-27 NOTE — Assessment & Plan Note (Signed)
The 10-year ASCVD risk score [name] DC Brooke Bonito., et al., 2013) is: 5.3%   Values used to calculate the score:     Age: 59 years     Sex: Female     Is Non-Hispanic African American: Yes     Diabetic: No     Tobacco smoker: No     Systolic Blood Pressure: 483 mmHg     Is BP treated: Yes     HDL Cholesterol: 46 mg/dL     Total Cholesterol: 197 mg/dL  Refilled Lipitor. Recheck in 3 months.

## 2020-02-24 ENCOUNTER — Other Ambulatory Visit: Payer: Self-pay

## 2020-02-24 ENCOUNTER — Ambulatory Visit (AMBULATORY_SURGERY_CENTER): Payer: Self-pay

## 2020-02-24 VITALS — Ht 63.0 in | Wt 210.0 lb

## 2020-02-24 DIAGNOSIS — Z1211 Encounter for screening for malignant neoplasm of colon: Secondary | ICD-10-CM

## 2020-02-24 MED ORDER — NA SULFATE-K SULFATE-MG SULF 17.5-3.13-1.6 GM/177ML PO SOLN: 1.0000 | Freq: Once | ORAL | 0 refills | Status: AC

## 2020-02-24 NOTE — Progress Notes (Signed)
No egg or soy allergy known to patient  No issues with past sedation with any surgeries or procedures No intubation problems in the past ---no past surgeries No FH of Malignant Hyperthermia No diet pills per patient No home 02 use per patient  No blood thinners per patient  Pt denies issues with constipation  No A fib or A flutter  COVID 19 guidelines implemented in PV today with Pt and RN  COVID vaccines completed x 2 per pt; Code to Pharmacy and  NO PA's for preps discussed with pt in PV today  Due to the COVID-19 pandemic we are asking patients to follow certain guidelines.  Pt aware of COVID protocols and [facility] guidelines

## 2020-03-03 ENCOUNTER — Other Ambulatory Visit: Payer: Self-pay

## 2020-03-03 ENCOUNTER — Ambulatory Visit (INDEPENDENT_AMBULATORY_CARE_PROVIDER_SITE_OTHER): Payer: 59 | Admitting: Family Medicine

## 2020-03-03 ENCOUNTER — Encounter: Payer: Self-pay | Admitting: Family Medicine

## 2020-03-03 VITALS — BP 118/72 | HR 62 | Ht 63.0 in | Wt 210.0 lb

## 2020-03-03 DIAGNOSIS — Z23 Encounter for immunization: Secondary | ICD-10-CM | POA: Diagnosis not present

## 2020-03-03 DIAGNOSIS — M79605 Pain in left leg: Secondary | ICD-10-CM

## 2020-03-03 HISTORY — DX: Pain in left leg: M79.605

## 2020-03-03 MED ORDER — NAPROXEN 500 MG PO TABS: 500.0000 mg | ORAL_TABLET | Freq: Two times a day (BID) | ORAL | 1 refills | Status: DC | PRN

## 2020-03-03 MED ORDER — TIZANIDINE HCL 2 MG PO CAPS: ORAL_CAPSULE | ORAL | 3 refills | Status: DC

## 2020-03-03 NOTE — Progress Notes (Signed)
Amber Willis is alone Sources of clinical information for visit is/are patient and past medical records. Nursing assessment for this office visit was reviewed with the patient for accuracy and revision.     Previous Report(s) Reviewed: historical medical records and x-ray reports  Depression screen [facility] 2/9 03/03/2020  Decreased Interest 2  Down, Depressed, Hopeless 0  PHQ - 2 Score 2  Altered sleeping 2  Tired, decreased energy 2  Change in appetite 0  Feeling bad or failure about yourself  0  Trouble concentrating 1  Moving slowly or fidgety/restless 1  Suicidal thoughts 0  PHQ-9 Score 8  Difficult doing work/chores -    No flowsheet data found.  PHQ9 SCORE ONLY 03/03/2020 11/25/2019 05/06/2019  PHQ-9 Total Score 8 17 10     Adult vaccines due  Topic Date Due  . TETANUS/TDAP  03/21/2013    Health Maintenance Due  Topic Date Due  . COVID-19 Vaccine (1) Never done  . COLONOSCOPY (Pts 45-53yrs Insurance coverage will need to be confirmed)  Never done  . TETANUS/TDAP  03/21/2013  . PAP SMEAR-Modifier  03/28/2020      History/P.E. limitations: none  Adult vaccines due  Topic Date Due  . TETANUS/TDAP  03/21/2013   There are no preventive care reminders to display for this patient.  Health Maintenance Due  Topic Date Due  . COVID-19 Vaccine (1) Never done  . COLONOSCOPY (Pts 45-3yrs Insurance coverage will need to be confirmed)  Never done  . TETANUS/TDAP  03/21/2013  . PAP SMEAR-Modifier  03/28/2020     Chief Complaint  Patient presents with  . Leg Pain    CPT E&M Office Visit Time Before Visit; reviewing medical records (e.g. recent visits, labs, studies): 5 minutes During Visit (F2F time): 20 minutes After Visit (discussion with family or HCP, prescribing, ordering, referring, calling result/recommendations or documenting on same day): 10 minutes Total Visit Time: 35 minutes  Visit Problem List with A/P  Leg pain, posterior, left New complaint with  uncertain prognosis  Onset: 4 days ago Location: posterior right knee Quality: aching Severity: moderate Function: not interfering with work which requires standing all day; pain during time to fall asleep Pattern: intermittent Course: stable Radiation: both into posterior thigh and right buttock and into right calf Relief: APAP provides partial relief.  Rest helps. Precipitant: no recalled injury Associated Symptoms:       Restricted ROM/stiffness/swelling:  no       Muscle ache/cramp/spasms: no       Color or temperature change: no       Muscle strength change: no       Change in sensation (dysesthesia/itch or numbness): no Functional Impact:       Still working   Trauma (Acute or Chronic): none recalled Prior Diagnostic Testing or Treatments:  03/06/17 DG XR Right Knee: Tricompartmental OA, and chondrocalcinosis 05/09/19 office visit [facility] with bilateral knee pain - treated with Naprosyn, APAP, Tizanidine  Physical Atalgic gait  Right calf 45 cm, left calf 44.5 cm Tenderness to palpation right calf, not left calf Full AROM right knee No pain hip internal ext rotation No ttp grt troch Tenderness into middle portion of distal right femur region - reproduces complaint pain Back without tenderness No recent travel  A/ Working diagnosis of musculoskeletal pain. In region of adductor magnus and longus insertion and vastus medius origins. Unlikely, but will check D-Dimer to r/o, hopefully, DVT.  Patient may take tizanidine 2 mg tab, 2 tablets at bedtime  and 1 tablet the two other ttimes.

## 2020-03-03 NOTE — Patient Instructions (Signed)
I believe you have a strained hamstring muscle causing the pain in the back of your left leg, but it could possibly, but unlikely be a blood clot.   We are checking a blood test today that will help decide if there is a blood clot.  If the test is normal, then there is no blood clot.  If the test is positive, then you will need an Ultrasound of your left leg's veins to see if there is a blood clot.   Please take the Naprosyn medicine twice a day if you need it for leg pain.  Take the medicine with food.   You may increase you bedtime Tizanidine to two tablets. Take only one tablet the other times of day.

## 2020-03-03 NOTE — Assessment & Plan Note (Addendum)
New complaint with uncertain prognosis  Onset: 4 days ago Location: posterior right knee Quality: aching Severity: moderate Function: not interfering with work which requires standing all day; pain during time to fall asleep Pattern: intermittent Course: stable Radiation: both into posterior thigh and right buttock and into right calf Relief: APAP provides partial relief.  Rest helps. Precipitant: no recalled injury Associated Symptoms:       Restricted ROM/stiffness/swelling:  no       Muscle ache/cramp/spasms: no       Color or temperature change: no       Muscle strength change: no       Change in sensation (dysesthesia/itch or numbness): no Functional Impact:       Still working   Trauma (Acute or Chronic): none recalled Prior Diagnostic Testing or Treatments:  03/06/17 DG XR Right Knee: Tricompartmental OA, and chondrocalcinosis 05/09/19 office visit [facility] with bilateral knee pain - treated with Naprosyn, APAP, Tizanidine  Physical Atalgic gait  Right calf 45 cm, left calf 44.5 cm Tenderness to palpation right calf, not left calf Full AROM right knee No pain hip internal ext rotation No ttp grt troch Tenderness into middle portion of distal right femur region - reproduces complaint pain Back without tenderness No recent travel  A/ Working diagnosis of musculoskeletal pain. In region of adductor magnus and longus insertion and vastus medius origins. Unlikely, but will check D-Dimer to r/o, hopefully, DVT.  Patient may take tizanidine 2 mg tab, 2 tablets at bedtime and 1 tablet the two other ttimes.

## 2020-03-04 ENCOUNTER — Other Ambulatory Visit: Payer: Self-pay

## 2020-03-04 DIAGNOSIS — M79605 Pain in left leg: Secondary | ICD-10-CM

## 2020-03-04 LAB — D-DIMER, QUANTITATIVE: D-DIMER: 0.35 mg/L FEU (ref 0.00–0.49)

## 2020-03-05 ENCOUNTER — Telehealth: Payer: Self-pay | Admitting: Family Medicine

## 2020-03-06 NOTE — Telephone Encounter (Signed)
Patient is returning Dr. McDiarmid's phone call. She would like for him to call her back. She said the best time to call her is after 3:30.   The best call back is [phone].

## 2020-03-06 NOTE — Telephone Encounter (Signed)
Routed to Dr. Wendy Poet. Salvatore Marvel, CMA

## 2020-03-09 ENCOUNTER — Ambulatory Visit (AMBULATORY_SURGERY_CENTER): Payer: 59 | Admitting: Gastroenterology

## 2020-03-09 ENCOUNTER — Other Ambulatory Visit: Payer: Self-pay

## 2020-03-09 ENCOUNTER — Encounter: Payer: Self-pay | Admitting: Gastroenterology

## 2020-03-09 VITALS — BP 120/59 | HR 48 | Temp 97.1°F | Resp 15 | Ht 63.0 in | Wt 210.0 lb

## 2020-03-09 DIAGNOSIS — Z1211 Encounter for screening for malignant neoplasm of colon: Secondary | ICD-10-CM | POA: Diagnosis not present

## 2020-03-09 DIAGNOSIS — D126 Benign neoplasm of colon, unspecified: Secondary | ICD-10-CM

## 2020-03-09 DIAGNOSIS — D123 Benign neoplasm of transverse colon: Secondary | ICD-10-CM | POA: Diagnosis not present

## 2020-03-09 DIAGNOSIS — D124 Benign neoplasm of descending colon: Secondary | ICD-10-CM

## 2020-03-09 MED ORDER — SODIUM CHLORIDE 0.9 % IV SOLN: 500.0000 mL | Freq: Once | INTRAVENOUS | Status: DC

## 2020-03-09 NOTE — Patient Instructions (Signed)
Information on polyps given to you today.  Await pathology results.  Resume previous diet and medications.  YOU HAD AN ENDOSCOPIC PROCEDURE TODAY AT THE [facility]:   Refer to the procedure report that was given to you for any specific questions about what was found during the examination.  If the procedure report does not answer your questions, please call your gastroenterologist to clarify.  If you requested that your care partner not be given the details of your procedure findings, then the procedure report has been included in a sealed envelope for you to review at your convenience later.  YOU SHOULD EXPECT: Some feelings of bloating in the abdomen. Passage of more gas than usual.  Walking can help get rid of the air that was put into your GI tract during the procedure and reduce the bloating. If you had a lower endoscopy (such as a colonoscopy or flexible sigmoidoscopy) you may notice spotting of blood in your stool or on the toilet paper. If you underwent a bowel prep for your procedure, you may not have a normal bowel movement for a few days.  Please Note:  You might notice some irritation and congestion in your nose or some drainage.  This is from the oxygen used during your procedure.  There is no need for concern and it should clear up in a day or so.  SYMPTOMS TO REPORT IMMEDIATELY:   Following lower endoscopy (colonoscopy or flexible sigmoidoscopy):  Excessive amounts of blood in the stool  Significant tenderness or worsening of abdominal pains  Swelling of the abdomen that is new, acute  Fever of 100F or higher   For urgent or emergent issues, a gastroenterologist can be reached at any hour by calling [phone]. Do not use MyChart messaging for urgent concerns.    DIET:  We do recommend a small meal at first, but then you may proceed to your regular diet.  Drink plenty of fluids but you should avoid alcoholic beverages for 24 hours.  ACTIVITY:  You should  plan to take it easy for the rest of today and you should NOT DRIVE or use heavy machinery until tomorrow (because of the sedation medicines used during the test).    FOLLOW UP: Our staff will call the number listed on your records 48-72 hours following your procedure to check on you and address any questions or concerns that you may have regarding the information given to you following your procedure. If we do not reach you, we will leave a message.  We will attempt to reach you two times.  During this call, we will ask if you have developed any symptoms of COVID 19. If you develop any symptoms (ie: fever, flu-like symptoms, shortness of breath, cough etc.) before then, please call [phone].  If you test positive for Covid 19 in the 2 weeks post procedure, please call and report this information to us.    If any biopsies were taken you will be contacted by phone or by letter within the next 1-3 weeks.  Please call us at [phone] if you have not heard about the biopsies in 3 weeks.    SIGNATURES/CONFIDENTIALITY: You and/or your care partner have signed paperwork which will be entered into your electronic medical record.  These signatures attest to the fact that that the information above on your After Visit Summary has been reviewed and is understood.  Full responsibility of the confidentiality of this discharge information lies with you and/or your care-partner. 

## 2020-03-09 NOTE — Progress Notes (Signed)
Called to room to assist during endoscopic procedure.  Patient ID and intended procedure confirmed with present staff. Received instructions for my participation in the procedure from the performing physician.  

## 2020-03-09 NOTE — Op Note (Signed)
[facility] Patient Name: Amber Willis Procedure Date: 03/09/2020 8:26 AM MRN: [id number] Endoscopist: Thornton Park MD, MD Age: 60 Referring MD:  Date of Birth: 05/13/1960 Gender: Female Account #: [id number] Procedure:                Colonoscopy Indications:              Screening for colorectal malignant neoplasm                           No known family history of colon cancer or polyps                           She reports a prior colonoscopy many years ago                           No baseline GI symptoms Medicines:                Monitored Anesthesia Care Procedure:                Pre-Anesthesia Assessment:                           - Prior to the procedure, a History and Physical                            was performed, and patient medications and                            allergies were reviewed. The patient's tolerance of                            previous anesthesia was also reviewed. The risks                            and benefits of the procedure and the sedation                            options and risks were discussed with the patient.                            All questions were answered, and informed consent                            was obtained. Prior Anticoagulants: The patient has                            taken no previous anticoagulant or antiplatelet                            agents. ASA Grade Assessment: II - A patient with                            mild systemic disease. After reviewing the risks  and benefits, the patient was deemed in                            satisfactory condition to undergo the procedure.                           After obtaining informed consent, the colonoscope                            was passed under direct vision. Throughout the                            procedure, the patient's blood pressure, pulse, and                            oxygen saturations were monitored continuously.  The                            Olympus CF-HQ190L [id number]) Colonoscope was                            introduced through the anus and advanced to the the                            cecum, identified by appendiceal orifice and                            ileocecal valve. A second forward view of the right                            colon was performed. The colonoscopy was performed                            without difficulty. The patient tolerated the                            procedure well. The quality of the bowel                            preparation was good. The ileocecal valve,                            appendiceal orifice, and rectum were photographed. Scope In: 8:38:54 AM Scope Out: 8:56:26 AM Scope Withdrawal Time: 0 hours 13 minutes 31 seconds  Total Procedure Duration: 0 hours 17 minutes 32 seconds  Findings:                 The perianal and digital rectal examinations were                            normal.                           Three sessile polyps were found in the descending  colon, splenic flexure and hepatic flexure. The                            polyps were 1 to 2 mm in size. These polyps were                            removed with a cold snare. Resection and retrieval                            were complete. Estimated blood loss was minimal.                           The exam was otherwise without abnormality on                            direct and retroflexion views. Complications:            No immediate complications. Estimated blood loss:                            Minimal. Estimated Blood Loss:     Estimated blood loss was minimal. Impression:               - Three 1 to 2 mm polyps in the descending colon,                            at the splenic flexure and at the hepatic flexure,                            removed with a cold snare. Resected and retrieved.                           - The examination was otherwise normal on  direct                            and retroflexion views. Recommendation:           - Patient has a contact number available for                            emergencies. The signs and symptoms of potential                            delayed complications were discussed with the                            patient. Return to normal activities tomorrow.                            Written discharge instructions were provided to the                            patient.                           -  Resume previous diet.                           - Continue present medications.                           - Await pathology results.                           - Repeat colonoscopy date to be determined after                            pending pathology results are reviewed for                            surveillance.                           - Emerging evidence supports eating a diet of                            fruits, vegetables, grains, calcium, and yogurt                            while reducing red meat and alcohol may reduce the                            risk of colon cancer.                           - Thank you for allowing me to be involved in your                            colon cancer prevention. Thornton Park MD, MD 03/09/2020 8:59:51 AM This report has been signed electronically.

## 2020-03-09 NOTE — Progress Notes (Signed)
Report to PACU, RN, vss, BBS= Clear.  

## 2020-03-10 NOTE — Telephone Encounter (Signed)
I discussed the normal D-Dimer test with Amber Willis and that this would make a DVT very unlikely.

## 2020-03-11 ENCOUNTER — Telehealth: Payer: Self-pay

## 2020-03-11 NOTE — Telephone Encounter (Signed)
LVM

## 2020-03-17 ENCOUNTER — Encounter: Payer: Self-pay | Admitting: Gastroenterology

## 2020-03-23 ENCOUNTER — Ambulatory Visit: Payer: 59 | Admitting: Family Medicine

## 2020-03-23 ENCOUNTER — Other Ambulatory Visit: Payer: Self-pay

## 2020-03-23 ENCOUNTER — Ambulatory Visit (INDEPENDENT_AMBULATORY_CARE_PROVIDER_SITE_OTHER): Payer: 59

## 2020-03-23 ENCOUNTER — Ambulatory Visit (INDEPENDENT_AMBULATORY_CARE_PROVIDER_SITE_OTHER): Payer: 59 | Admitting: Family Medicine

## 2020-03-23 VITALS — BP 120/72 | HR 65 | Ht 63.0 in | Wt 211.1 lb

## 2020-03-23 DIAGNOSIS — Z23 Encounter for immunization: Secondary | ICD-10-CM

## 2020-03-23 DIAGNOSIS — I10 Essential (primary) hypertension: Secondary | ICD-10-CM

## 2020-03-23 DIAGNOSIS — G8929 Other chronic pain: Secondary | ICD-10-CM

## 2020-03-23 DIAGNOSIS — M25562 Pain in left knee: Secondary | ICD-10-CM

## 2020-03-23 DIAGNOSIS — M25561 Pain in right knee: Secondary | ICD-10-CM

## 2020-03-23 DIAGNOSIS — F331 Major depressive disorder, recurrent, moderate: Secondary | ICD-10-CM | POA: Diagnosis not present

## 2020-03-23 DIAGNOSIS — E7800 Pure hypercholesterolemia, unspecified: Secondary | ICD-10-CM | POA: Diagnosis not present

## 2020-03-23 DIAGNOSIS — E785 Hyperlipidemia, unspecified: Secondary | ICD-10-CM

## 2020-03-23 DIAGNOSIS — R7303 Prediabetes: Secondary | ICD-10-CM

## 2020-03-23 LAB — POCT GLYCOSYLATED HEMOGLOBIN (HGB A1C): Hemoglobin A1C: 5.8 % — AB (ref 4.0–5.6)

## 2020-03-23 MED ORDER — PAROXETINE HCL 30 MG PO TABS: 30.0000 mg | ORAL_TABLET | Freq: Every day | ORAL | 2 refills | Status: DC

## 2020-03-23 MED ORDER — ACETAMINOPHEN 500 MG PO TABS: 1000.0000 mg | ORAL_TABLET | Freq: Four times a day (QID) | ORAL | 3 refills | Status: DC | PRN

## 2020-03-23 MED ORDER — ATORVASTATIN CALCIUM 40 MG PO TABS
40.0000 mg | ORAL_TABLET | Freq: Every day | ORAL | 3 refills | Status: DC
Start: 2020-03-23 — End: 2020-09-30

## 2020-03-23 MED ORDER — DICLOFENAC SODIUM 1 % EX GEL: 2.0000 g | Freq: Four times a day (QID) | CUTANEOUS | 1 refills | Status: DC

## 2020-03-23 MED ORDER — LISINOPRIL-HYDROCHLOROTHIAZIDE 10-12.5 MG PO TABS: 1.0000 | ORAL_TABLET | Freq: Every day | ORAL | 3 refills | Status: DC

## 2020-03-23 NOTE — Progress Notes (Unsigned)
   SUBJECTIVE:   CHIEF COMPLAINT / HPI:   Chief Complaint  Patient presents with  . Knee Pain  . Hypertension     Amber Willis is a 60 y.o. female here for follow up of her chronic medical conditions.   Chronic Knee Pain  Patient reports pain relief with Naprosyn but Zanaflex gives her a headache sometimes. No change in knee pain. Hurts "just a bit" today. Stands on feet for 4-9 hours a day at [employer].   Hypercholesteremia  Pt reports no adverse effects with Lipitor. Denies missing any doses.  HTN Takes HCTZ-Lisinopril. Denies missing doses of antihypertensive medications. Denies chest pain, palpitations, lower extremity edema, exertional dyspnea, lightheadedness, headaches and vision changes.  Home BP Monitoring: No  Exercises:  not active Low salt diet: "tries"     PERTINENT  PMH / PSH: reviewed and updated as appropriate   OBJECTIVE:   BP 120/72   Pulse 65   Ht 5\' 3"  (1.6 m)   Wt 211 lb 2 oz (95.8 kg)   LMP 01/17/2017   SpO2 98%   BMI 37.40 kg/m    GEN: pleasant older female, in no acute distress  CV: regular rate and rhythm, no murmurs appreciated  RESP: no increased work of breathing, clear to ascultation bilaterally  MSK: no LE edema, or calf tenderness, normal gait, no knee edema or deformity bilaterally  SKIN: warm, dry    ASSESSMENT/PLAN:   Prediabetes Stable. Previous a1c 5.9. recheck today 5.8. Counseled on healthy dietary choices.   HYPERTENSION, BENIGN SYSTEMIC Stable.  BP at goal.  Medications: Lisinopril / HCTZ  Cardiovascular risk factors: obesity (BMI >= 30 kg/m2) and sedentary lifestyle.  Exercise: Encouraged to increase physical activity as tolerated.  Diet Pattern: Heart healthy dietary choices discussed.  LDL, a1c today       Hyperlipidemia Recheck LDL. Continue Lipitor. The 10-year ASCVD risk score Amber Bussing DC Brooke Bonito., Amber al., Amber Willis) is: 5.9%   Values used to calculate the score:     Age: 45 years     Sex: Female     Is  Non-Hispanic African American: Yes     Diabetic: No     Tobacco smoker: No     Systolic Blood Pressure: 673 mmHg     Is BP treated: Yes     HDL Cholesterol: 46 mg/dL     Total Cholesterol: 197 mg/dL   Bilateral knee pain Stable. Continue Naprosyn BID PRN and Tylenol TID. Discontinue Voltaren tablets. Start Voltaren gel QID. Remains active despite chronic knee pain.      [name], DO PGY-2, [facility] 03/23/2020

## 2020-03-23 NOTE — Patient Instructions (Signed)
It was great seeing you today!  Please check-out at the front desk before leaving the clinic. I'd like to see you back in 6 but if you need to be seen earlier than that for any new issues we're happy to fit you in, just give [place] a call!  Visit Remembers: - Stop by the pharmacy to pick up your prescriptions - Continue to work on your healthy eating habits and incorporating exercise into your daily life. (see below) - Your goal is to have an A1c <7 - Medicine Changes: Apply the Voltaren (diclofenac) gel to you knees up to 4 times a day.  -Stop taking Diclofenac pills as you are also taking Naprosyn.  These medications do the same thing.     Diet Recommendations for Prediabetes  Carbohydrate includes starch, sugar, and fiber.  Of these, only sugar and starch raise blood glucose.  (Fiber is found in fruits, vegetables [especially skin, seeds, and stalks] and whole grains.)   Starchy (carb) foods: Bread, rice, pasta, potatoes, corn, cereal, grits, crackers, bagels, muffins, all baked goods.  (Fruit, milk, and yogurt also have carbohydrate, but most of these foods will not spike your blood sugar as most starchy foods will.)  A few fruits do cause high blood sugars; use small portions of bananas (limit to 1/2 at a time), grapes, watermelon, oranges, and most tropical fruits.   Protein foods: Meat, fish, poultry, eggs, dairy foods, and beans such as pinto and kidney beans (beans also provide carbohydrate).   1. Eat at least REAL 3 meals and 1-2 snacks per day. Never go more than 4-5 hours while awake without eating. Eat breakfast within the first hour of getting up.   2. Limit starchy foods to TWO per meal and ONE per snack. ONE portion of a starchy food is equal to the following:   - ONE slice of bread (or its equivalent, such as half of a hamburger bun).   - 1/2 cup of a "scoopable" starchy food such as potatoes or rice.   - 15 grams of Total Carbohydrate as shown on food label.   - Every 4 ounces  of a sweet drink (including fruit juice). 3. Include at every meal: a protein food, a carb food, and vegetables and/or fruit.   - Obtain twice the volume of veg's as protein or carbohydrate foods for both lunch and dinner.   - Fresh or frozen veg's are best.   - Keep frozen veg's on hand for a quick vegetable serving.       Regarding lab work today:  Due to recent changes in healthcare laws, you may see the results of your imaging and laboratory studies on MyChart before your doctor has had a chance to review them.  We understand that in some cases there may be results that are confusing or concerning to you. Not all laboratory results come back in the same time frame and your doctor may be waiting for multiple results in order to interpret others.  Please give [place] 72 hours in order for your doctor to thoroughly review all the results before contacting the office for clarification of your results. If everything is normal, you will get a letter in the mail or a message in My Chart. Please give [place] a call if you do not hear from [place] after 2 weeks.  Please bring all of your medications with you to each visit.    If you haven't already, sign up for My Chart to have easy access  to your labs results, and communication with your primary care physician.  Feel free to call with any questions or concerns at any time, at [phone].   Take care,  Dr. Rushie [facility]

## 2020-03-23 NOTE — Assessment & Plan Note (Signed)
Stable.  BP at goal.  Medications: Lisinopril / HCTZ  Cardiovascular risk factors: obesity (BMI >= 30 kg/m2) and sedentary lifestyle.  Exercise: Encouraged to increase physical activity as tolerated.  Diet Pattern: Heart healthy dietary choices discussed.  LDL, a1c today

## 2020-03-23 NOTE — Assessment & Plan Note (Addendum)
Stable. Previous a1c 5.9. recheck today 5.8. Counseled on healthy dietary choices.

## 2020-03-24 LAB — LDL CHOLESTEROL, DIRECT: LDL Direct: 80 mg/dL (ref 0–99)

## 2020-03-26 NOTE — Assessment & Plan Note (Signed)
Stable. Continue Naprosyn BID PRN and Tylenol TID. Discontinue Voltaren tablets. Start Voltaren gel QID. Remains active despite chronic knee pain.

## 2020-03-26 NOTE — Assessment & Plan Note (Signed)
Recheck LDL. Continue Lipitor. The 10-year ASCVD risk score [name] DC Brooke Bonito., et al., 2013) is: 5.9%   Values used to calculate the score:     Age: 60 years     Sex: Female     Is Non-Hispanic African American: Yes     Diabetic: No     Tobacco smoker: No     Systolic Blood Pressure: 419 mmHg     Is BP treated: Yes     HDL Cholesterol: 46 mg/dL     Total Cholesterol: 197 mg/dL

## 2020-06-17 ENCOUNTER — Other Ambulatory Visit: Payer: Self-pay | Admitting: Family Medicine

## 2020-06-17 DIAGNOSIS — Z1231 Encounter for screening mammogram for malignant neoplasm of breast: Secondary | ICD-10-CM

## 2020-08-10 ENCOUNTER — Ambulatory Visit: Payer: 59

## 2020-09-11 ENCOUNTER — Other Ambulatory Visit: Payer: Self-pay | Admitting: Family Medicine

## 2020-09-11 DIAGNOSIS — F331 Major depressive disorder, recurrent, moderate: Secondary | ICD-10-CM

## 2020-09-29 NOTE — Progress Notes (Signed)
   SUBJECTIVE:   CHIEF COMPLAINT / HPI:   Chief Complaint  Patient presents with   Follow-up    A1C     Amber Willis is a 60 y.o. female here for follow up of her chronic health conditions.   HTN Denies missing doses of antihypertensive medications. Denies chest pain, palpitations, lower extremity edema, exertional dyspnea, lightheadedness, headaches and vision changes. BP 118/70 yesterday.  Home BP Monitoring: Yes  Exercises:  moderately active - walks every other day for about 30 minutes  Low salt diet: yes     HLD Patient taking Lipitor as prescribed. Denies missing any doses. Reports no medication side effects.   Depression  States she she "good".  Taking Paxil daily.   Chronic Knee pain  Works at [employer]. Has  intermittent knee and shoulder pain. Takes pain medication and applies gel when she needs hurting more than usual.   Tingling Pt reports left arm tingling at night. Denies weakness and numbness. Denies chest pain, jaw pain, sweating during episodes, PND and DOE. Notes that she does sleep with her arms folded to one side near her face.    PERTINENT  PMH / PSH: reviewed and updated as appropriate   OBJECTIVE:   BP 122/70   Pulse 62   Ht '5\' 3"'$  (1.6 m)   Wt 200 lb 8 oz (90.9 kg)   LMP 01/17/2017   SpO2 98%   BMI 35.52 kg/m   GEN: pleasant well appearing female, in no acute distress  CV: regular rate and rhythm, no murmurs appreciated  RESP: no increased work of breathing, clear to ascultation bilaterally ABD: Bowel sounds present. Soft, non-tender, non-distended.  MSK: no edema, paraesthesia with pressure to ulnar nerve SKIN: warm, dry   ASSESSMENT/PLAN:   HYPERTENSION, BENIGN SYSTEMIC Stable. BP at goal.  Medications: Lisinpril-HCTZ, no changes  Exercise: Encouraged to increase physical activity as tolerated.  Diet Pattern: Heart healthy dietary choices discussed.  BMP today   Bilateral knee pain Stable. Very active. Currently  taking Tylenol BID.  Increase Tylenol 1000 mg 3 times daily for maintainence. Refilled Naprosyn 500 mg BID PRN and Voltargen gel QID PRN.   Hyperlipidemia Refilled Lipitor 40 mg. Previous LDL 125. Repeat lipid panel today. Pt often eats fast food as she has easy access at her job. The 10-year ASCVD risk score (Arnett DK, et al., 2019) is: 14.6%.  - may need higher intensity statin if LDL remains elevated   Type 2 diabetes mellitus (Amber Willis) New diagnosis. Pt previously well controled with diet. Previous a1c 5.8. today;s a1c 9.9. Start 500 mg metformin daiy. Provided instructions to ramp up to 1000 mg BID. Encouraged continued diet rich in vegetables and complex carbs.  Heart healthy carb modified diet. 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 Counseled on need to continue exercising. Follow up in 1 month.   Statin therapy: Lipitor ACEi/ARB: Lisinopril Urine microalbumine: at next visit  Eye exam: discuss at next visit    Major depressive disorder Amber Willis) Stable. PHQ 9 Score of 13. Refiled Paxil 30 mg.    Vaccines: Shingrix, COVID and influenza     [name], DO PGY-3, [facility] 09/30/2020

## 2020-09-30 ENCOUNTER — Ambulatory Visit (INDEPENDENT_AMBULATORY_CARE_PROVIDER_SITE_OTHER): Payer: 59 | Admitting: Family Medicine

## 2020-09-30 ENCOUNTER — Other Ambulatory Visit: Payer: Self-pay

## 2020-09-30 ENCOUNTER — Ambulatory Visit
Admission: RE | Admit: 2020-09-30 | Discharge: 2020-09-30 | Disposition: A | Discharge status: Home / Self Care | Payer: 59 | Source: Ambulatory Visit | Attending: Family Medicine | Admitting: Family Medicine

## 2020-09-30 ENCOUNTER — Encounter: Payer: Self-pay | Admitting: Family Medicine

## 2020-09-30 VITALS — BP 122/70 | HR 62 | Ht 63.0 in | Wt 200.5 lb

## 2020-09-30 DIAGNOSIS — M79605 Pain in left leg: Secondary | ICD-10-CM

## 2020-09-30 DIAGNOSIS — R7303 Prediabetes: Secondary | ICD-10-CM

## 2020-09-30 DIAGNOSIS — M25562 Pain in left knee: Secondary | ICD-10-CM

## 2020-09-30 DIAGNOSIS — I10 Essential (primary) hypertension: Secondary | ICD-10-CM | POA: Diagnosis not present

## 2020-09-30 DIAGNOSIS — E785 Hyperlipidemia, unspecified: Secondary | ICD-10-CM

## 2020-09-30 DIAGNOSIS — E1122 Type 2 diabetes mellitus with diabetic chronic kidney disease: Secondary | ICD-10-CM

## 2020-09-30 DIAGNOSIS — F331 Major depressive disorder, recurrent, moderate: Secondary | ICD-10-CM

## 2020-09-30 DIAGNOSIS — G8929 Other chronic pain: Secondary | ICD-10-CM

## 2020-09-30 DIAGNOSIS — M25561 Pain in right knee: Secondary | ICD-10-CM

## 2020-09-30 DIAGNOSIS — Z1231 Encounter for screening mammogram for malignant neoplasm of breast: Secondary | ICD-10-CM

## 2020-09-30 DIAGNOSIS — N183 Chronic kidney disease, stage 3 unspecified: Secondary | ICD-10-CM

## 2020-09-30 LAB — POCT GLYCOSYLATED HEMOGLOBIN (HGB A1C): HbA1c, POC (controlled diabetic range): 9.9 % — AB (ref 0.0–7.0)

## 2020-09-30 MED ORDER — DICLOFENAC SODIUM 1 % EX GEL: 2.0000 g | Freq: Four times a day (QID) | CUTANEOUS | 1 refills | Status: DC

## 2020-09-30 MED ORDER — PAROXETINE HCL 30 MG PO TABS: 30.0000 mg | ORAL_TABLET | Freq: Every day | ORAL | 2 refills | Status: DC

## 2020-09-30 MED ORDER — METFORMIN HCL 500 MG PO TABS: ORAL_TABLET | ORAL | 0 refills | Status: DC

## 2020-09-30 MED ORDER — ATORVASTATIN CALCIUM 40 MG PO TABS: 40.0000 mg | ORAL_TABLET | Freq: Every day | ORAL | 1 refills | Status: DC

## 2020-09-30 MED ORDER — NAPROXEN 500 MG PO TABS: 500.0000 mg | ORAL_TABLET | Freq: Two times a day (BID) | ORAL | 1 refills | Status: DC | PRN

## 2020-09-30 MED ORDER — LISINOPRIL-HYDROCHLOROTHIAZIDE 10-12.5 MG PO TABS: 1.0000 | ORAL_TABLET | Freq: Every day | ORAL | 3 refills | Status: DC

## 2020-09-30 MED ORDER — ACETAMINOPHEN 500 MG PO TABS: 1000.0000 mg | ORAL_TABLET | Freq: Four times a day (QID) | ORAL | 3 refills | Status: AC | PRN

## 2020-09-30 NOTE — Patient Instructions (Signed)
It was great seeing you today!  Please check-out at the front desk before leaving the clinic. I'd like to see you back in 3 months but if you need to be seen earlier than that for any new issues we're happy to fit you in, just give [place] a call!  Visit Remembers: - Stop by the pharmacy to pick up your prescriptions  - Continue to work on your healthy eating habits and incorporating exercise into your daily life. (see below) - Your goal is to have an A1c < 7 - Medicine Changes: Start taking Metformin      Diet Recommendations for Diabetes  Carbohydrate includes starch, sugar, and fiber.  Of these, only sugar and starch raise blood glucose.  (Fiber is found in fruits, vegetables [especially skin, seeds, and stalks] and whole grains.)   Starchy (carb) foods: Bread, rice, pasta, potatoes, corn, cereal, grits, crackers, bagels, muffins, all baked goods.  (Fruit, milk, and yogurt also have carbohydrate, but most of these foods will not spike your blood sugar as most starchy foods will.)  A few fruits do cause high blood sugars; use small portions of bananas (limit to 1/2 at a time), grapes, watermelon, oranges, and most tropical fruits.   Protein foods: Meat, fish, poultry, eggs, dairy foods, and beans such as pinto and kidney beans (beans also provide carbohydrate).   1. Eat at least REAL 3 meals and 1-2 snacks per day. Never go more than 4-5 hours while awake without eating. Eat breakfast within the first hour of getting up.   2. Limit starchy foods to TWO per meal and ONE per snack. ONE portion of a starchy food is equal to the following:   - ONE slice of bread (or its equivalent, such as half of a hamburger bun).   - 1/2 cup of a "scoopable" starchy food such as potatoes or rice.   - 15 grams of Total Carbohydrate as shown on food label.   - Every 4 ounces of a sweet drink (including fruit juice). 3. Include at every meal: a protein food, a carb food, and vegetables and/or fruit.   - Obtain  twice the volume of veg's as protein or carbohydrate foods for both lunch and dinner.   - Fresh or frozen veg's are best.   - Keep frozen veg's on hand for a quick vegetable serving.       Regarding lab work today:  Due to recent changes in healthcare laws, you may see the results of your imaging and laboratory studies on MyChart before your doctor has had a chance to review them.  We understand that in some cases there may be results that are confusing or concerning to you. Not all laboratory results come back in the same time frame and your doctor may be waiting for multiple results in order to interpret others.  Please give [place] 72 hours in order for your doctor to thoroughly review all the results before contacting the office for clarification of your results. If everything is normal, you will get a letter in the mail or a message in My Chart. Please give [place] a call if you do not hear from [place] after 2 weeks.  Please bring all of your medications with you to each visit.    If you haven't already, sign up for My Chart to have easy access to your labs results, and communication with your primary care physician.  Feel free to call with any questions or concerns at any time, at  [phone].   Take care,  Dr. Rushie [facility]

## 2020-10-01 LAB — LIPID PANEL
Chol/HDL Ratio: 4.3 ratio (ref 0.0–4.4)
Cholesterol, Total: 221 mg/dL — ABNORMAL HIGH (ref 100–199)
HDL: 51 mg/dL (ref >39)
LDL Chol Calc (NIH): 158 mg/dL — ABNORMAL HIGH (ref 0–99)
Triglycerides: 66 mg/dL (ref 0–149)
VLDL Cholesterol Cal: 12 mg/dL (ref 5–40)

## 2020-10-01 LAB — BASIC METABOLIC PANEL
BUN/Creatinine Ratio: 17 (ref 9–23)
BUN: 12 mg/dL (ref 6–24)
CO2: 26 mmol/L (ref 20–29)
Calcium: 9.3 mg/dL (ref 8.7–10.2)
Chloride: 102 mmol/L (ref 96–106)
Creatinine, Ser: 0.69 mg/dL (ref 0.57–1.00)
Glucose: 87 mg/dL (ref 65–99)
Potassium: 4.5 mmol/L (ref 3.5–5.2)
Sodium: 141 mmol/L (ref 134–144)
eGFR: 100 mL/min/{1.73_m2} (ref >59)

## 2020-10-02 NOTE — Assessment & Plan Note (Signed)
Stable. Very active. Currently taking Tylenol BID.  Increase Tylenol 1000 mg 3 times daily for maintainence. Refilled Naprosyn 500 mg BID PRN and Voltargen gel QID PRN.

## 2020-10-02 NOTE — Assessment & Plan Note (Signed)
Stable. BP at goal.  Medications: Lisinpril-HCTZ, no changes  Exercise: Encouraged to increase physical activity as tolerated.  Diet Pattern: Heart healthy dietary choices discussed.  BMP today

## 2020-10-02 NOTE — Assessment & Plan Note (Signed)
New diagnosis. Pt previously well controled with diet. Previous a1c 5.8. today;s a1c 9.9. Start 500 mg metformin daiy. Provided instructions to ramp up to 1000 mg BID. Encouraged continued diet rich in vegetables and complex carbs.  Heart healthy carb modified diet. 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 Counseled on need to continue exercising. Follow up in 1 month.   Statin therapy: Lipitor ACEi/ARB: Lisinopril Urine microalbumine: at next visit  Eye exam: discuss at next visit

## 2020-10-02 NOTE — Assessment & Plan Note (Signed)
Stable. PHQ 9 Score of 13. Refiled Paxil 30 mg.

## 2020-10-02 NOTE — Assessment & Plan Note (Signed)
Refilled Lipitor 40 mg. Previous LDL 125. Repeat lipid panel today. Pt often eats fast food as she has easy access at her job. The 10-year ASCVD risk score (Arnett DK, et al., 2019) is: 14.6%.  - may need higher intensity statin if LDL remains elevated

## 2020-10-07 ENCOUNTER — Ambulatory Visit: Payer: 59

## 2020-10-09 ENCOUNTER — Ambulatory Visit: Payer: 59

## 2020-10-14 ENCOUNTER — Ambulatory Visit (INDEPENDENT_AMBULATORY_CARE_PROVIDER_SITE_OTHER): Payer: 59

## 2020-10-14 ENCOUNTER — Other Ambulatory Visit: Payer: Self-pay

## 2020-10-14 DIAGNOSIS — Z23 Encounter for immunization: Secondary | ICD-10-CM | POA: Diagnosis not present

## 2020-10-14 NOTE — Progress Notes (Signed)
Patient presents to nurse clinic for flu vaccination. Administered in RD, site unremarkable, tolerated injection well.   Ely Ballen C Raenah Murley, RN  

## 2020-10-19 ENCOUNTER — Telehealth: Payer: Self-pay

## 2020-10-19 NOTE — Telephone Encounter (Signed)
Patient LVM on nurse line requesting a call back to discuss medications. I attempted to call patient, however no answer or option for VM.

## 2020-10-29 ENCOUNTER — Telehealth: Payer: Self-pay | Admitting: Family Medicine

## 2020-10-29 NOTE — Telephone Encounter (Signed)
Error X2.

## 2020-10-29 NOTE — Telephone Encounter (Signed)
Error

## 2020-11-04 ENCOUNTER — Telehealth: Payer: Self-pay | Admitting: Family Medicine

## 2020-11-04 NOTE — Telephone Encounter (Signed)
Called pt x2 to discuss need to repeat PAP. Pt is due for PAP. Unable to leave voicemail as her voicemail is not set up.    Asked team to reach out to pt tomorrow.    [name], DO

## 2020-11-06 ENCOUNTER — Ambulatory Visit: Payer: 59

## 2020-11-16 ENCOUNTER — Ambulatory Visit (INDEPENDENT_AMBULATORY_CARE_PROVIDER_SITE_OTHER): Payer: 59

## 2020-11-16 ENCOUNTER — Other Ambulatory Visit: Payer: Self-pay

## 2020-11-16 ENCOUNTER — Telehealth: Payer: Self-pay | Admitting: Family Medicine

## 2020-11-16 DIAGNOSIS — Z23 Encounter for immunization: Secondary | ICD-10-CM

## 2020-11-16 NOTE — Telephone Encounter (Signed)
Error

## 2020-12-23 ENCOUNTER — Ambulatory Visit (INDEPENDENT_AMBULATORY_CARE_PROVIDER_SITE_OTHER): Payer: 59 | Admitting: Family Medicine

## 2020-12-23 ENCOUNTER — Encounter: Payer: Self-pay | Admitting: Family Medicine

## 2020-12-23 ENCOUNTER — Other Ambulatory Visit: Payer: Self-pay

## 2020-12-23 VITALS — BP 110/64 | HR 54 | Ht 63.0 in | Wt 197.2 lb

## 2020-12-23 DIAGNOSIS — F331 Major depressive disorder, recurrent, moderate: Secondary | ICD-10-CM | POA: Diagnosis not present

## 2020-12-23 DIAGNOSIS — E785 Hyperlipidemia, unspecified: Secondary | ICD-10-CM | POA: Diagnosis not present

## 2020-12-23 DIAGNOSIS — I10 Essential (primary) hypertension: Secondary | ICD-10-CM | POA: Diagnosis not present

## 2020-12-23 DIAGNOSIS — R7303 Prediabetes: Secondary | ICD-10-CM | POA: Diagnosis not present

## 2020-12-23 LAB — POCT GLYCOSYLATED HEMOGLOBIN (HGB A1C): HbA1c, POC (controlled diabetic range): 5.8 % (ref 0.0–7.0)

## 2020-12-23 MED ORDER — METFORMIN HCL 500 MG PO TABS: 500.0000 mg | ORAL_TABLET | Freq: Two times a day (BID) | ORAL | 0 refills | Status: DC

## 2020-12-23 MED ORDER — PAROXETINE HCL 30 MG PO TABS: 30.0000 mg | ORAL_TABLET | Freq: Every day | ORAL | 1 refills | Status: DC

## 2020-12-23 MED ORDER — ATORVASTATIN CALCIUM 80 MG PO TABS: 80.0000 mg | ORAL_TABLET | Freq: Every day | ORAL | 1 refills | Status: DC

## 2020-12-23 MED ORDER — LISINOPRIL-HYDROCHLOROTHIAZIDE 10-12.5 MG PO TABS: 1.0000 | ORAL_TABLET | Freq: Every day | ORAL | 3 refills | Status: DC

## 2020-12-23 MED ORDER — ATORVASTATIN CALCIUM 80 MG PO TABS: 40.0000 mg | ORAL_TABLET | Freq: Every day | ORAL | 1 refills | Status: DC

## 2020-12-23 NOTE — Progress Notes (Signed)
1C

## 2020-12-23 NOTE — Assessment & Plan Note (Addendum)
Stable. BP at goal. Continue lisinopril-HCTZ (10-12.5 mg).  Last BMP September - normal K and serum Cr. Refill sent to pharmacy

## 2020-12-23 NOTE — Assessment & Plan Note (Addendum)
A1c 5.8 and previously 9.9. Prior to the 9.9 pt's a1c 5.8-5.9.  She has not been taking metformin since October. She only took received a 30-d supply. Suspect the 9.9 reading was inaccurate. Discontinue metformin. Encouraged continued lifestyle changes.  - recheck in 3 months

## 2020-12-23 NOTE — Patient Instructions (Signed)
  It was great seeing you today!  Please check-out at the front desk before leaving the clinic. I'd like to see you back in 01/28/20 for your PAP  but if you need to be seen earlier than that for any new issues we're happy to fit you in, just give [place] a call!  Visit Remembers: - Stop by the pharmacy to pick up your prescriptions  - Continue to work on your healthy eating habits and incorporating exercise into your daily life. (see below) - Your goal is to have an A1c < 7 - Medicine Changes: increase Lipitor to 80 mg. Stop taking metformin    Diet Recommendations for Prediabetes  Carbohydrate includes starch, sugar, and fiber.  Of these, only sugar and starch raise blood glucose.  (Fiber is found in fruits, vegetables [especially skin, seeds, and stalks] and whole grains.)   Starchy (carb) foods: Bread, rice, pasta, potatoes, corn, cereal, grits, crackers, bagels, muffins, all baked goods.  (Fruit, milk, and yogurt also have carbohydrate, but most of these foods will not spike your blood sugar as most starchy foods will.)  A few fruits do cause high blood sugars; use small portions of bananas (limit to 1/2 at a time), grapes, watermelon, oranges, and most tropical fruits.   Protein foods: Meat, fish, poultry, eggs, dairy foods, and beans such as pinto and kidney beans (beans also provide carbohydrate).   1. Eat at least REAL 3 meals and 1-2 snacks per day. Never go more than 4-5 hours while awake without eating. Eat breakfast within the first hour of getting up.   2. Limit starchy foods to TWO per meal and ONE per snack. ONE portion of a starchy food is equal to the following:   - ONE slice of bread (or its equivalent, such as half of a hamburger bun).   - 1/2 cup of a "scoopable" starchy food such as potatoes or rice.   - 15 grams of Total Carbohydrate as shown on food label.   - Every 4 ounces of a sweet drink (including fruit juice). 3. Include at every meal: a protein food, a carb food, and  vegetables and/or fruit.   - Obtain twice the volume of veg's as protein or carbohydrate foods for both lunch and dinner.   - Fresh or frozen veg's are best.   - Keep frozen veg's on hand for a quick vegetable serving.       Please bring all of your medications with you to each visit.    If you haven't already, sign up for My Chart to have easy access to your labs results, and communication with your primary care physician.  Feel free to call with any questions or concerns at any time, at [phone].   Take care,  Dr. [facility]ie [facility]

## 2020-12-23 NOTE — Progress Notes (Signed)
   SUBJECTIVE:   CHIEF COMPLAINT / HPI:   Chief Complaint  Patient presents with   Follow-up    A1C     Amber Willis is a 60 y.o. female here for diabetes follow up.  She has been watching what she eats. Pt reports she lost 3 lbs. She has not been taking metformin since October. Eats oatmeal and a piece a fruit for breakfast instead of the fast food from her job.   Denies chest pain, shortness of breath, leg swelling.    PERTINENT  PMH / PSH: reviewed and updated as appropriate   OBJECTIVE:   BP 110/64   Pulse (!) 54   Ht 5\' 3"  (1.6 m)   Wt 197 lb 4 oz (89.5 kg)   LMP 01/17/2017   SpO2 98%   BMI 34.94 kg/m    GEN: pleasant well appearing female, in no acute distress  CV: regular rate and rhythm, no murmurs appreciated  RESP: no increased work of breathing, clear to ascultation bilaterally MSK: no LE edema SKIN: warm, dry, no rash on visible skin   ASSESSMENT/PLAN:   Hyperlipidemia Stable. LDL not at goal.  The 10-year ASCVD risk score (Arnett DK, et al., 2019) is: 10.9%. Increase Lipitor 80 mg.   - Recheck LDL at f/u      HYPERTENSION, BENIGN SYSTEMIC Stable. BP at goal. Continue lisinopril-HCTZ (10-12.5 mg).  Last BMP September - normal K and serum Cr. Refill sent to pharmacy   Prediabetes A1c 5.8 and previously 9.9. Prior to the 9.9 pt's a1c 5.8.  She has not been taking metformin since October.  Doubt the 9.9 reading was accurate. Discontinue metformin. Encouraged continued lifestyle changes.     PAP scheduled for 11/27/20.  Discuss vaccines at that visit.     Amber Hensen, DO PGY-3, [facility] 12/23/2020

## 2020-12-23 NOTE — Assessment & Plan Note (Signed)
Stable. LDL not at goal.  The 10-year ASCVD risk score (Arnett DK, et al., 2019) is: 10.9%. Increase Lipitor 80 mg.   - Recheck LDL at f/u

## 2021-01-27 ENCOUNTER — Other Ambulatory Visit: Payer: Self-pay

## 2021-01-27 ENCOUNTER — Ambulatory Visit (INDEPENDENT_AMBULATORY_CARE_PROVIDER_SITE_OTHER): Payer: 59 | Admitting: Family Medicine

## 2021-01-27 ENCOUNTER — Encounter: Payer: Self-pay | Admitting: Family Medicine

## 2021-01-27 ENCOUNTER — Other Ambulatory Visit (HOSPITAL_COMMUNITY)
Admission: RE | Admit: 2021-01-27 | Discharge: 2021-01-27 | Disposition: A | Discharge status: Home / Self Care | Payer: 59 | Source: Ambulatory Visit | Attending: Family Medicine | Admitting: Family Medicine

## 2021-01-27 VITALS — BP 143/95 | HR 55 | Ht 63.0 in | Wt 204.6 lb

## 2021-01-27 DIAGNOSIS — Z124 Encounter for screening for malignant neoplasm of cervix: Secondary | ICD-10-CM

## 2021-01-27 DIAGNOSIS — Z113 Encounter for screening for infections with a predominantly sexual mode of transmission: Secondary | ICD-10-CM | POA: Diagnosis not present

## 2021-01-27 DIAGNOSIS — Z0000 Encounter for general adult medical examination without abnormal findings: Secondary | ICD-10-CM

## 2021-01-27 DIAGNOSIS — I10 Essential (primary) hypertension: Secondary | ICD-10-CM | POA: Diagnosis not present

## 2021-01-27 NOTE — Patient Instructions (Signed)
Follow up with me on 02/08/21 at 8: 50 AM to recheck your blood pressure.    Take Care,   Dr. Susa Simmonds

## 2021-01-27 NOTE — Progress Notes (Signed)
° °  SUBJECTIVE:   CHIEF COMPLAINT / HPI:    Amber Willis is a 61 y.o. female here for PAP smear.  She has no concerns today.   She is postmenopausal.  Denies vaginal bleeding.   Amber Willis denies smoking, drinking alcohol and illicit drug use.    PERTINENT  PMH / PSH: reviewed and updated as appropriate   OBJECTIVE:   BP (!) 149/92    Pulse (!) 55    Ht 5\' 3"  (1.6 m)    Wt 204 lb 9.6 oz (92.8 kg)    LMP 01/17/2017    SpO2 100%    BMI 36.24 kg/m   BP 143/95 on recheck    GEN: well appearing female in no acute distress  CVS: well perfused  RESP: speaking in full sentences without pause  ABD: soft, non-tender, non-distended, no palpable masses  Pelvic exam: normal external genitalia, vulva, VAGINA and CERVIX: normal appearing cervix without discharge or lesions, ADNEXA: normal adnexa in size, nontender and no masses, PAP smear collected, exam chaperoned by CMA Amber Willis.    ASSESSMENT/PLAN:   HYPERTENSION, BENIGN SYSTEMIC BP elevated in office.  Recheck 145/95 after about 10 minutes.  She took her BP medication around 7 AM.  Conintue Lisinopril 10 mg / HCTZ 12.5 mg.  Will follow up in office for BP recheck in 2 weeks.    Preventative health care In 2018, Amber Willis had an abnormal PAP (ASC-US) with positive HPV. Her subsequent PAP in 2019 was normal. PAP collected today.  Given history of + HPV she should receive PAP's every 3 years.  STI testing offered and patient agreeable.  HIV, RPR, GC/CT collected     [name], DO PGY-3, [facility] 01/27/2021

## 2021-01-30 ENCOUNTER — Encounter: Payer: Self-pay | Admitting: Family Medicine

## 2021-01-30 NOTE — Assessment & Plan Note (Signed)
BP elevated in office.  Recheck 145/95 after about 10 minutes.  She took her BP medication around 7 AM.  Conintue Lisinopril 10 mg / HCTZ 12.5 mg.  Will follow up in office for BP recheck in 2 weeks.

## 2021-01-30 NOTE — Assessment & Plan Note (Signed)
In 2018, Amber Willis had an abnormal PAP (ASC-US) with positive HPV. Her subsequent PAP in 2019 was normal. PAP collected today.  Given history of + HPV she should receive PAP's every 3 years.  STI testing offered and patient agreeable.  HIV, RPR, GC/CT collected

## 2021-02-01 LAB — CYTOLOGY - PAP
Adequacy: ABSENT
Chlamydia: NEGATIVE
Comment: NEGATIVE
Comment: NEGATIVE
Comment: NEGATIVE
Comment: NORMAL
Diagnosis: NEGATIVE
High risk HPV: NEGATIVE
Neisseria Gonorrhea: NEGATIVE
Trichomonas: NEGATIVE

## 2021-02-05 ENCOUNTER — Telehealth: Payer: Self-pay

## 2021-02-05 LAB — RPR W/REFLEX TO TREPSURE: RPR: NONREACTIVE

## 2021-02-05 LAB — HIV ANTIBODY (ROUTINE TESTING W REFLEX): HIV Screen 4th Generation wRfx: NONREACTIVE

## 2021-02-05 LAB — T PALLIDUM ANTIBODY, EIA: T pallidum Antibody, EIA: NEGATIVE

## 2021-02-05 NOTE — Telephone Encounter (Signed)
Amber Willis with [place] [facility] calls nurse line to report a false positive T Pallidum Antibody result.   Amber Willis reports they are in the process of fixing this and will send over true negative result via fax.  Fax number confirmed.

## 2021-02-08 ENCOUNTER — Encounter: Payer: Self-pay | Admitting: Family Medicine

## 2021-02-08 ENCOUNTER — Ambulatory Visit (INDEPENDENT_AMBULATORY_CARE_PROVIDER_SITE_OTHER): Payer: 59 | Admitting: Family Medicine

## 2021-02-08 ENCOUNTER — Other Ambulatory Visit: Payer: Self-pay

## 2021-02-08 VITALS — BP 144/82 | HR 54 | Ht 63.0 in | Wt 202.0 lb

## 2021-02-08 DIAGNOSIS — R7989 Other specified abnormal findings of blood chemistry: Secondary | ICD-10-CM

## 2021-02-08 DIAGNOSIS — I10 Essential (primary) hypertension: Secondary | ICD-10-CM | POA: Diagnosis not present

## 2021-02-08 DIAGNOSIS — R5383 Other fatigue: Secondary | ICD-10-CM | POA: Insufficient documentation

## 2021-02-08 NOTE — Patient Instructions (Addendum)
It was great seeing you today!  Follow up for blood pressure recheck on 03/04/21 at 8:30 AM.    Visit Remembers: - Continue to work on your healthy eating habits and incorporating exercise into your daily life.  - Your goal is to have an BP < 130/80 - Medicine Changes: Take two tablets of your blood pressure medicine (Lisinopril-HCTZ).  If you get lightheaded or dizzy. Please let me know.   Please bring all of your medications with you to each visit.    Feel free to call with any questions or concerns at any time, at [phone].   Take care,  Dr. [facility]ie [facility]

## 2021-02-08 NOTE — Assessment & Plan Note (Signed)
Uncontrolled.  BP not at goal. Recheck after 10-15 minutes seated with manual cuff was 144/82.  Increased HCTZ 25 mg with Lisinopril 20 mg (via combo pill).  Follow up scheduled. Future BMP ordered. Consider switching to ARB. Exercise: Encouraged to increase physical activity as tolerated. Diet Pattern: Heart healthy dietary choices discussed.

## 2021-02-08 NOTE — Progress Notes (Signed)
° °  SUBJECTIVE:   CHIEF COMPLAINT / HPI:    Amber Willis is a 61 y.o. female here for blood pressure follow up.    HTN Takes Lisinopril/HCTZ. Denies missing doses of antihypertensive medications. Denies chest pain, palpitations, lower extremity edema, exertional dyspnea, lightheadedness and vision changes. Checks BP at [facility]. Tries not to add food to her diet. Walks 20-25 minutes after getting off work.   Fatigue  Reports being more tired after getting off work. She gets into a slump mid-day as well.  Denies recent weight loss or gain. She is able to walk after work. No melania or hematochezia. She does not smoke.    PERTINENT  PMH / PSH: reviewed and updated as appropriate   OBJECTIVE:   BP (!) 144/82 Comment: manual   Pulse (!) 54    Ht 5\' 3"  (1.6 m)    Wt 202 lb (91.6 kg)    LMP 01/17/2017    SpO2 100%    BMI 35.78 kg/m    GEN: well appearing older female, in no acute distress  CV: regular rate and rhythm RESP: no increased work of breathing, clear to ascultation bilaterally  MSK: no  lower extremity edema  SKIN: warm, dry  ASSESSMENT/PLAN:   HYPERTENSION, BENIGN SYSTEMIC Uncontrolled.  BP not at goal. Recheck after 10-15 minutes seated with manual cuff was 144/82.  Increased HCTZ 25 mg with Lisinopril 20 mg (via combo pill).  Follow up scheduled. Future BMP ordered. Consider switching to ARB. Exercise: Encouraged to increase physical activity as tolerated. Diet Pattern: Heart healthy dietary choices discussed.   Fatigue Etiology unclear. Obtain TSH, CMP and CBC to assess for thyroid dysregulation, electrolyte abnormalities, infection and anemia. Cancer screening are UTD.      [name], DO PGY-3, [facility] 02/08/2021

## 2021-02-08 NOTE — Assessment & Plan Note (Signed)
Etiology unclear. Obtain TSH, CMP and CBC to assess for thyroid dysregulation, electrolyte abnormalities, infection and anemia. Cancer screening are UTD.

## 2021-02-09 LAB — TSH: TSH: 0.358 u[IU]/mL — ABNORMAL LOW (ref 0.450–4.500)

## 2021-02-10 NOTE — Addendum Note (Signed)
Addended by: [name] D on: 02/10/2021 08:34 PM   Modules accepted: Orders

## 2021-02-12 LAB — SPECIMEN STATUS REPORT

## 2021-02-12 LAB — T4, FREE: Free T4: 0.96 ng/dL (ref 0.82–1.77)

## 2021-02-15 ENCOUNTER — Encounter: Payer: Self-pay | Admitting: Family Medicine

## 2021-02-22 ENCOUNTER — Encounter: Payer: Self-pay | Admitting: Family Medicine

## 2021-02-22 DIAGNOSIS — Z6835 Body mass index (BMI) 35.0-35.9, adult: Secondary | ICD-10-CM | POA: Insufficient documentation

## 2021-03-04 ENCOUNTER — Ambulatory Visit (INDEPENDENT_AMBULATORY_CARE_PROVIDER_SITE_OTHER): Payer: 59 | Admitting: Family Medicine

## 2021-03-04 ENCOUNTER — Other Ambulatory Visit: Payer: Self-pay

## 2021-03-04 VITALS — BP 155/97 | HR 64 | Wt 195.6 lb

## 2021-03-04 DIAGNOSIS — I10 Essential (primary) hypertension: Secondary | ICD-10-CM | POA: Diagnosis not present

## 2021-03-04 DIAGNOSIS — R7989 Other specified abnormal findings of blood chemistry: Secondary | ICD-10-CM | POA: Diagnosis not present

## 2021-03-04 MED ORDER — LISINOPRIL-HYDROCHLOROTHIAZIDE 20-12.5 MG PO TABS: 1.0000 | ORAL_TABLET | Freq: Every day | ORAL | 0 refills | Status: DC

## 2021-03-04 NOTE — Patient Instructions (Signed)
Thank you for coming in today.  We discussed elevated blood pressure.  We will increase your Zestoretic to 20 mg of lisinopril and keep in the same hydrochlorothiazide 12.5 mg daily.  Follow-up in 2 weeks for blood pressure recheck and to check your kidney function.  Dr. Janus Molder

## 2021-03-04 NOTE — Assessment & Plan Note (Signed)
Of note TSH low and normal T4, obtain T3 at follow up. Consider referral to endocrinology.

## 2021-03-04 NOTE — Assessment & Plan Note (Addendum)
BP not well controlled.  Asymptomatic.  Recheck 142/105. Reviewed previous BP readings in office, last 2 visits it was elevated with similar readings. Elevated pressures at home.  Continue Zestoretic, reviewed prior creatinine within normal limits. Increased lisinopril 20 mg daily. Follow-up in 2 weeks for BP recheck and BMP.

## 2021-03-04 NOTE — Progress Notes (Signed)
° ° °  SUBJECTIVE:   CHIEF COMPLAINT / HPI:   Hypertension: - Medications: Zestoretic 10-12 0.5 daily - Compliance: Yes - Checking BP at home: Yes SBP 150s - Denies any SOB, CP, vision changes - Diet: Low-salt  PERTINENT  PMH / PSH: HLD, prediabetes, abnormal TSH  OBJECTIVE:   BP (!) 155/97    Pulse 64    Wt 195 lb 9.6 oz (88.7 kg)    LMP 01/17/2017    SpO2 99%    BMI 34.65 kg/m   Physical Exam Vitals reviewed.  Constitutional:      General: She is not in acute distress.    Appearance: She is not ill-appearing, toxic-appearing or diaphoretic.  Cardiovascular:     Rate and Rhythm: Normal rate and regular rhythm.     Heart sounds: Normal heart sounds.  Pulmonary:     Effort: Pulmonary effort is normal.     Breath sounds: Normal breath sounds.  Neurological:     Mental Status: She is alert and oriented to person, place, and time.  Psychiatric:        Mood and Affect: Mood normal.        Behavior: Behavior normal.   ASSESSMENT/PLAN:   HYPERTENSION, BENIGN SYSTEMIC BP not well controlled.  Asymptomatic.  Recheck 142/105. Reviewed previous BP readings in office, last 2 visits it was elevated with similar readings. Elevated pressures at home.  Continue Zestoretic, reviewed prior creatinine within normal limits. Increased lisinopril 20 mg daily. Follow-up in 2 weeks for BP recheck and BMP.  Abnormal TSH Of note TSH low and normal T4, obtain T3 at follow up. Consider referral to endocrinology.    Gerlene Fee, [place]

## 2021-03-23 ENCOUNTER — Encounter: Payer: Self-pay | Admitting: Family Medicine

## 2021-03-23 ENCOUNTER — Telehealth: Payer: Self-pay

## 2021-03-23 ENCOUNTER — Ambulatory Visit (INDEPENDENT_AMBULATORY_CARE_PROVIDER_SITE_OTHER): Payer: 59 | Admitting: Family Medicine

## 2021-03-23 ENCOUNTER — Other Ambulatory Visit: Payer: Self-pay

## 2021-03-23 VITALS — BP 126/80 | HR 64 | Ht 63.0 in | Wt 198.4 lb

## 2021-03-23 DIAGNOSIS — E785 Hyperlipidemia, unspecified: Secondary | ICD-10-CM

## 2021-03-23 DIAGNOSIS — Z23 Encounter for immunization: Secondary | ICD-10-CM

## 2021-03-23 DIAGNOSIS — G8929 Other chronic pain: Secondary | ICD-10-CM

## 2021-03-23 DIAGNOSIS — M25561 Pain in right knee: Secondary | ICD-10-CM

## 2021-03-23 DIAGNOSIS — M25562 Pain in left knee: Secondary | ICD-10-CM

## 2021-03-23 DIAGNOSIS — I10 Essential (primary) hypertension: Secondary | ICD-10-CM

## 2021-03-23 DIAGNOSIS — M79605 Pain in left leg: Secondary | ICD-10-CM | POA: Diagnosis not present

## 2021-03-23 MED ORDER — ATORVASTATIN CALCIUM 80 MG PO TABS: 80.0000 mg | ORAL_TABLET | Freq: Every day | ORAL | 1 refills | Status: DC

## 2021-03-23 MED ORDER — NAPROXEN 500 MG PO TABS: 500.0000 mg | ORAL_TABLET | Freq: Two times a day (BID) | ORAL | 1 refills | Status: DC | PRN

## 2021-03-23 NOTE — Assessment & Plan Note (Addendum)
Chronic and stable. Uses Naprosyn 500 mg as needed. Refilled at pt's request.  ?

## 2021-03-23 NOTE — Patient Instructions (Signed)
It was great seeing you today! ? ?Please check-out at the front desk before leaving the clinic. I'd like to see you back in 3 months but if you need to be seen earlier than that for any new issues we're happy to fit you in, just give [place] a call! ? ?Visit Remembers: ?- Stop by the pharmacy to pick up your prescriptions  ?- Continue to work on your healthy eating habits and incorporating exercise into your daily life.  ?- Your goal is to have an BP < 130/80 ? ? ?Regarding lab work today:  ?Due to recent changes in healthcare laws, you may see the results of your imaging and laboratory studies on MyChart before your provider has had a chance to review them.  I understand that in some cases there may be results that are confusing or concerning to you. Not all laboratory results come back in the same time frame and you may be waiting for multiple results in order to interpret others.  Please give [place] 72 hours in order for your provider to thoroughly review all the results before contacting the office for clarification of your results. If everything is normal, you will get a letter in the mail or a message in My Chart. Please give [place] a call if you do not hear from [place] after 2 weeks. ? ?Please bring all of your medications with you to each visit.  ?  ?If you haven't already, sign up for My Chart to have easy access to your labs results, and communication with your primary care physician. ? ?Feel free to call with any questions or concerns at any time, at [phone]. ?  ?Take care,  ?Dr. Susa Simmonds ?[place]  ?

## 2021-03-23 NOTE — Assessment & Plan Note (Addendum)
The 10-year ASCVD risk score (Arnett DK, et al., 2019) is: 16.9%.  Lipitor increased in Dec 2022. Refilled Lipitor 80 mg.  Last LDL 158 (Sept 2022).  Repeat LDL today.  ? ? ?

## 2021-03-23 NOTE — Assessment & Plan Note (Addendum)
BP at goal, 126/80.  Continue Lisinopril '20mg'$ -HCTZ 12.5 mg.  BMP today as Lisinopril increased at previous visit.  ?

## 2021-03-23 NOTE — Telephone Encounter (Signed)
Patient LVM on nurse line regarding needing refill on Paroxetine. Per chart review this was sent over on 12/23/20- 90 day supply with one refill.  ? ?Attempted x 2 to reach patient to inform that she should have refill at the pharmacy. Patient did not answer and VM was not set up. If she calls back, please inform that refill is at pharmacy.  ? ?Talbot Grumbling, RN ? ?

## 2021-03-23 NOTE — Progress Notes (Signed)
? ?  SUBJECTIVE:  ? ?CHIEF COMPLAINT / HPI:  ? ?Chief Complaint  ?Patient presents with  ? Follow-up  ?  BP  ? ? ? ?Amber Willis is a 61 y.o. female here for BP follow up   ? ?HTN ?Takes Lisinopril/HCTXZ. Denies missing doses of antihypertensive medications. Denies chest pain, palpitations, lower extremity edema, exertional dyspnea, lightheadedness, headaches and vision changes.  ? ? ? ?PERTINENT  PMH / PSH: reviewed and updated as appropriate  ? ?OBJECTIVE:  ? ?BP 126/80   Pulse 64   Ht '5\' 3"'$  (1.6 m)   Wt 198 lb 6 oz (90 kg)   LMP 01/17/2017   SpO2 98%   BMI 35.14 kg/m?   ? ?GEN: pleasant well appearing older female, in no acute distress  ?CV: regular rate and rhythm ?RESP: no increased work of breathing, clear to ascultation bilaterally ?SKIN: warm, dry, no rash on visible skin ? ? ?ASSESSMENT/PLAN:  ? ?HYPERTENSION, BENIGN SYSTEMIC ?BP at goal, 126/80.  Continue Lisinopril '20mg'$ -HCTZ 12.5 mg.  BMP today as Lisinopril increased at previous visit.  ? ?Bilateral knee pain ?Chronic and stable. Uses Naprosyn 500 mg as needed. Refilled at pt's request.  ? ?Hyperlipidemia ?The 10-year ASCVD risk score (Arnett DK, et al., 2019) is: 16.9%.  Lipitor increased in Dec 2022. Refilled Lipitor 80 mg.  Last LDL 158 (Sept 2022).  Repeat LDL today.  ? ? ? ?Healthcare Maintenance  ?Discusses Shingles vaccine and pt agreeable. Rx sent to pharmacy  ? ?[name], DO ?PGY-3, [facility] ?03/23/2021  ? ? ? ? ? ? ? ? ?

## 2021-03-24 LAB — BASIC METABOLIC PANEL
BUN/Creatinine Ratio: 21 (ref 12–28)
BUN: 15 mg/dL (ref 8–27)
CO2: 25 mmol/L (ref 20–29)
Calcium: 9 mg/dL (ref 8.7–10.3)
Chloride: 106 mmol/L (ref 96–106)
Creatinine, Ser: 0.72 mg/dL (ref 0.57–1.00)
Glucose: 98 mg/dL (ref 70–99)
Potassium: 4.5 mmol/L (ref 3.5–5.2)
Sodium: 145 mmol/L — ABNORMAL HIGH (ref 134–144)
eGFR: 96 mL/min/{1.73_m2} (ref >59)

## 2021-03-24 LAB — LDL CHOLESTEROL, DIRECT

## 2021-03-25 LAB — LDL CHOLESTEROL, DIRECT: LDL Direct: 72 mg/dL (ref 0–99)

## 2021-03-25 LAB — SPECIMEN STATUS REPORT

## 2021-04-06 ENCOUNTER — Ambulatory Visit (INDEPENDENT_AMBULATORY_CARE_PROVIDER_SITE_OTHER): Payer: 59 | Admitting: Family Medicine

## 2021-04-06 ENCOUNTER — Other Ambulatory Visit: Payer: Self-pay

## 2021-04-06 VITALS — BP 130/64 | HR 57 | Ht 63.0 in | Wt 197.4 lb

## 2021-04-06 DIAGNOSIS — Z566 Other physical and mental strain related to work: Secondary | ICD-10-CM

## 2021-04-06 NOTE — Progress Notes (Signed)
? ? ?  SUBJECTIVE:  ? ?CHIEF COMPLAINT / HPI: stress at work ? ?Reports having increased stress at work since yesterday.  Coworkers have been picking at her.  Has not discussed this with supervisor.  Denies any decrease in appetite, change in mood or sleep.  Has not lost interest in previous activities.  Reports having some relationship issues but did not go into this.  No financial difficulties.  Denies any SI/HI and has not thought of any plan to commit self harm.  Compliant with Prozac 30 mg daily and has not skipped any doses.  ? ?PERTINENT  PMH / PSH:  ?Mood Disorder ? ?OBJECTIVE:  ? ?BP 130/64   Pulse (!) 57   Ht '5\' 3"'$  (1.6 m)   Wt 197 lb 6 oz (89.5 kg)   LMP 01/17/2017   SpO2 100%   BMI 34.96 kg/m?   ? ?General: Alert, no acute distress ?Psych: mood flat, good eye contact, speech normal, no SI/HI, visual or auditory hallucinations ? ?  04/06/2021  ?  8:27 AM 03/23/2021  ?  8:31 AM 03/04/2021  ?  8:33 AM 02/08/2021  ?  8:25 AM 01/27/2021  ?  8:45 AM  ?Depression screen PHQ 2/9  ?Decreased Interest '1 2 2 1 2  '$ ?Down, Depressed, Hopeless  0 0 2 0  ?PHQ - 2 Score '1 2 2 3 2  '$ ?Altered sleeping '1 2 2 3 2  '$ ?Tired, decreased energy '1 2 2 2 2  '$ ?Change in appetite 0 0 0 0 0  ?Feeling bad or failure about yourself  1 0 0 0 0  ?Trouble concentrating 0 0 0 1 1  ?Moving slowly or fidgety/restless 0 0 0 2 0  ?Suicidal thoughts 0 0 0 0 0  ?PHQ-9 Score '4 6 6 11 7  '$ ?Difficult doing work/chores Not difficult at all Not difficult at all Not difficult at all Not difficult at all Not difficult at all  ?  ? ?  05/06/2019  ?  9:51 AM  ?GAD 7 : Generalized Anxiety Score  ?Nervous, Anxious, on Edge 0  ?Control/stop worrying 0  ?Worry too much - different things 2  ?Trouble relaxing 2  ?Restless 0  ?Easily annoyed or irritable 0  ?Afraid - awful might happen 0  ?Total GAD 7 Score 4  ?Anxiety Difficulty Not difficult at all  ? ?  ? ?ASSESSMENT/PLAN:  ? ?Stress at work ? PHQ9 score 4, decreased from previous visit, and GAD score 4.   ?CCM  referral to help patient connect with outside mental health therapy and resources to help minimize stress level ?Note for off work until 04/08/21. ?Atarax 5 mg qhs x 15 tablets ?[place] information provided ?Follow up with PCP in 1 week ?  ? ? ?Carollee Leitz, MD ?[place]  ?

## 2021-04-06 NOTE — Patient Instructions (Addendum)
Thank you for coming to see me today. It was a pleasure. Today we talked about:  ? ?Recommend you call to schedule an appointment with a psychiatrist as well as a therapist to help with discussing your stress and how to help ease this. ? ?[facility] ?[address] [place], [place] [address] ?[phone] ? ?Please follow-up with PCP in 1 week ? ?If you have any questions or concerns, please do not hesitate to call the office at [phone]. ? ?Best,  ? ?Carollee Leitz, MD   ? ?Psychiatry Resource List (Adults and Children) ?Most of these providers will take Medicaid. please consult your insurance for a complete and updated list of available providers. When calling to make an appointment have your insurance information available to confirm you are covered. ? ?[place]:   ?[place]: [address]     [phone] ?[place]: [id number],        [phone] ?[place]: [address] [place],    [phone] ?[place]: [address],                   [phone] ? ?[facility]  (Psychiatry & counseling ; adults & children ; will take Medicaid) ?[address] [place], [place]        [phone] ? ? ?[facility]  (Psychiatry only; Adults only, will take Medicaid)  ?[place], [place], [place] [address]       [phone] ? ? ?[facility] (Psychiatry & counseling ; adults & children ; will take Medicaid ?[address]  [place] [address]   ?[phone]    ([place] therapist) ? ?[facility]  Psychiatry & counseling; Adults and children;  ?[address]    [place], [place] [address]    [phone] ? ?[facility] (Psychiatry & counseling; adults & children; Medicare no Medicaid)  ?[place], [place]  [address]      [phone]   ? ?[facility] (up to age 54)  ?Psychiatry &  counseling ,will take Medicaid, must do counseling to receive psychiatry services  ?[place] [address]        [phone] ? ?[facility] (Psychiatry & counseling; adults & children; will take Medicaid) ?[address] [place],  [place], [place]  [phone] ? ?[facility]---  Walk-in Mon-Fri, 8:30-5:00 (will take Medicaid)  ?[address], [place], [place]  [phone]  ? ? ?[facility] --- Walk-In Mon-Friday 8am-3pm ( will take Medicaid, Psychiatry, Adults & children,  ?[address], [place], [place]   [phone] ? ? ?[facility]--, Walk-in M-F 8am-12pm and 1pm -3pm   ?(Counseling, Psychiatry, will take Medicaid, adults & children) ? [address] [place], [place]  [phone]    ?

## 2021-04-07 ENCOUNTER — Telehealth: Payer: Self-pay

## 2021-04-07 MED ORDER — HYDROXYZINE HCL 10 MG PO TABS: 5.0000 mg | ORAL_TABLET | Freq: Every day | ORAL | 0 refills | Status: DC

## 2021-04-07 NOTE — Telephone Encounter (Signed)
Prescription sent

## 2021-04-07 NOTE — Telephone Encounter (Signed)
Patient calls nurse line reporting she was seen yesterday in office due to stress and insomnia.  ? ?Patient reports she declined a sleep aide at the time of visit, however has change her mind. Patient reports she has been up since 3am.  ? ?Will forward to provider who saw patient.  ? ?[address] ?

## 2021-04-09 ENCOUNTER — Encounter: Payer: Self-pay | Admitting: Family Medicine

## 2021-04-09 DIAGNOSIS — Z566 Other physical and mental strain related to work: Secondary | ICD-10-CM | POA: Insufficient documentation

## 2021-04-09 NOTE — Assessment & Plan Note (Signed)
PHQ9 score 4, decreased from previous visit, and GAD score 4.   ?CCM referral to help patient connect with outside mental health therapy and resources to help minimize stress level ?Note for off work until 04/08/21. ?Atarax 5 mg qhs x 15 tablets ?[place] information provided ?Follow up with PCP in 1 week ?

## 2021-04-12 NOTE — Progress Notes (Signed)
? ? ?  SUBJECTIVE:  ? ?CHIEF COMPLAINT / HPI: 1 week f/u for stress at work  ? ?Patient reports issues with co-workers and teasing. She was written to return to work on 04/08/21. She reports feeling better and that she has been able to return to work. She states that her stress has improved.  ?She continues to adhere to regimen with paroxetine '30mg'$ . She states that the hydroxyzine helped with sleeping. She reports typically falling asleep around 9:30 PM and waking at 3:30 and then 4:30 for her work. She reports she would like to get a refill of the hydroxyzine. She denies any HA or GI upset with the new medication regimen. When asked about her stress level, she reports she has been able to move on.   ? ?PERTINENT  PMH / PSH:  ?MDD ? ?OBJECTIVE:  ? ?BP 107/70   Pulse 70   Ht '5\' 3"'$  (1.6 m)   Wt 192 lb (87.1 kg)   LMP 01/17/2017   SpO2 95%   BMI 34.01 kg/m?   ?General: female alert, making appropriate eye contact, well groomed, calm appearing ?Motor activity: no psychomotor agitation or slowing ?Speech: normal rate, volume and fluency  ?Mood: patient states mood is 9/10  ?Thought Content: coherent, no delusions, no A/V hallucincations, demonstrates normal insight & judgement ?Cognition: oriented to time, place and person ? ? ?ASSESSMENT/PLAN:  ? ?Stress at work ?Reports decreased stress level  ?Improved sleep on hydroxyzine, continue, refills provided  ?  ?Recommended follow up in 1 month for mood check or as scheduled in June  ? ?Eulis Foster, MD ?[place]  ?

## 2021-04-13 ENCOUNTER — Other Ambulatory Visit: Payer: Self-pay

## 2021-04-13 ENCOUNTER — Ambulatory Visit (INDEPENDENT_AMBULATORY_CARE_PROVIDER_SITE_OTHER): Payer: 59 | Admitting: Family Medicine

## 2021-04-13 VITALS — BP 107/70 | HR 70 | Ht 63.0 in | Wt 192.0 lb

## 2021-04-13 DIAGNOSIS — Z566 Other physical and mental strain related to work: Secondary | ICD-10-CM

## 2021-04-13 MED ORDER — HYDROXYZINE HCL 10 MG PO TABS: 5.0000 mg | ORAL_TABLET | Freq: Every day | ORAL | 0 refills | Status: DC

## 2021-04-13 NOTE — Assessment & Plan Note (Signed)
Reports decreased stress level  ?Improved sleep on hydroxyzine, continue, refills provided  ? ?

## 2021-04-13 NOTE — Patient Instructions (Signed)
I am glad to hear that you are feeling better in regards to your stress levels and have been able to return to work. ? ?Please continue your Paxil daily.  You can continue the hydroxyzine as needed to help with sleep. ? ?I recommend following up with your PCP in 4-6 weeks. ?

## 2021-04-20 ENCOUNTER — Telehealth: Payer: Self-pay | Admitting: *Deleted

## 2021-04-20 NOTE — Chronic Care Management (AMB) (Signed)
?  Care Management  ? ?Outreach Note ? ?04/20/2021 ?Name: Amber Willis MRN: [id number] DOB: 1960-10-12 ? ?Referred by: [name], DO ?Reason for referral : Care Coordination (Initial outreach to schedule referral with SW) ? ? ?An unsuccessful telephone outreach was attempted today. The patient was referred to the case management team for assistance with care management and care coordination.  ? ?Follow Up Plan:  ?The care management team will reach out to the patient again over the next 7 days. If patient returns call to provider office, please advise to call [facility] at [phone]. ? ?Laverda Sorenson  ?Care Guide, Embedded Care Coordination ?[facility]  Care Management  ?Direct Dial: [phone] ? ?

## 2021-05-03 NOTE — Chronic Care Management (AMB) (Signed)
?  Care Management  ? ?Outreach Note ? ?05/03/2021 ?Name: Amber Willis MRN: [id number] DOB: 1960/09/05 ? ?Referred by: [name], DO ?Reason for referral : Care Coordination (Initial outreach to schedule referral with SW) ? ? ?A second unsuccessful telephone outreach was attempted today. The patient was referred to the case management team for assistance with care management and care coordination.  ? ?Follow Up Plan:  ?The care management team will reach out to the patient again over the next 7 days.  ?If patient returns call to provider office, please advise to call [facility] * at [phone].* ? ?Laverda Sorenson  ?Care Guide, Embedded Care Coordination ?[facility]  Care Management  ?Direct Dial: [phone] ? ?

## 2021-05-07 NOTE — Chronic Care Management (AMB) (Signed)
?  Care Management  ? ?Outreach Note ? ?05/07/2021 ?Name: Amber Willis MRN: [id number] DOB: 15-Nov-1960 ? ?Referred by: [name], DO ?Reason for referral : Care Coordination (Initial outreach to schedule referral with SW) ? ? ?Third unsuccessful telephone outreach was attempted today. The patient was referred to the case management team for assistance with care management and care coordination. The patient's primary care provider has been notified of our unsuccessful attempts to make or maintain contact with the patient. The care management team is pleased to engage with this patient at any time in the future should he/she be interested in assistance from the care management team.  ? ?Follow Up Plan:  The care management team is available to follow up with the patient after provider conversation with the patient regarding recommendation for care management engagement and subsequent re-referral to the care management team.  ? ?Laverda Sorenson  ?Care Guide, Embedded Care Coordination ?[facility]  Care Management  ?Direct Dial: [phone] ? ?

## 2021-07-05 ENCOUNTER — Encounter: Payer: Self-pay | Admitting: Family Medicine

## 2021-07-05 ENCOUNTER — Ambulatory Visit (INDEPENDENT_AMBULATORY_CARE_PROVIDER_SITE_OTHER): Payer: 59 | Admitting: Family Medicine

## 2021-07-05 VITALS — BP 137/60 | HR 52 | Ht 63.0 in | Wt 191.2 lb

## 2021-07-05 DIAGNOSIS — R7303 Prediabetes: Secondary | ICD-10-CM | POA: Diagnosis not present

## 2021-07-05 DIAGNOSIS — E785 Hyperlipidemia, unspecified: Secondary | ICD-10-CM | POA: Diagnosis not present

## 2021-07-05 DIAGNOSIS — F331 Major depressive disorder, recurrent, moderate: Secondary | ICD-10-CM

## 2021-07-05 DIAGNOSIS — M79605 Pain in left leg: Secondary | ICD-10-CM

## 2021-07-05 DIAGNOSIS — I10 Essential (primary) hypertension: Secondary | ICD-10-CM | POA: Diagnosis not present

## 2021-07-05 LAB — POCT GLYCOSYLATED HEMOGLOBIN (HGB A1C): HbA1c, POC (prediabetic range): 5.8 % (ref 5.7–6.4)

## 2021-07-05 MED ORDER — NAPROXEN 500 MG PO TABS: 500.0000 mg | ORAL_TABLET | Freq: Two times a day (BID) | ORAL | 1 refills | Status: DC | PRN

## 2021-07-05 MED ORDER — ATORVASTATIN CALCIUM 80 MG PO TABS: 80.0000 mg | ORAL_TABLET | Freq: Every day | ORAL | 1 refills | Status: DC

## 2021-07-05 MED ORDER — PAROXETINE HCL 30 MG PO TABS: 30.0000 mg | ORAL_TABLET | Freq: Every day | ORAL | 1 refills | Status: DC

## 2021-07-05 MED ORDER — LISINOPRIL-HYDROCHLOROTHIAZIDE 20-12.5 MG PO TABS
1.0000 | ORAL_TABLET | Freq: Every day | ORAL | 1 refills | Status: DC
Start: 2021-07-05 — End: 2022-02-19

## 2021-07-05 NOTE — Assessment & Plan Note (Signed)
Stable.  BP at goal.  Continue lisinopril 20 mg with HCTZ 12.5 mg.  Serum creatinine stable.

## 2021-07-05 NOTE — Assessment & Plan Note (Signed)
PHQ-9 score of 10, 0 first question #9.  Doing well with Paxil.  I would like to try different medication in the future as Paxil due to age. Patient not willing to switch at this time.

## 2021-07-05 NOTE — Progress Notes (Signed)
   SUBJECTIVE:   CHIEF COMPLAINT / HPI:   Chief Complaint  Patient presents with   Follow-up     Amber Willis is a 61 y.o. female here for:    Prediabetes   Eats breakfast at [employer] and cooks dinner at home. Occasionally will eat a snack for lunch if she has time.  Walks 2.5 miles after work.  Has been trying to watch what she eats.   HLD Denies missing any doses. Reports no medication side effects.  HTN Denies missing doses of antihypertensive medications. Denies chest pain, palpitations, lower extremity edema, exertional dyspnea, lightheadedness, headaches and vision changes.   Depression Stress at work has greatly improved. Taking Paxil. States "I'm good now."  Left Knee Pain Has anterior and medial left knee pain. Has gotten injections in the past. She was given some medicine that helped before but doesn't recall the name. No falls or injury. Took Tylenol with some relief.     PERTINENT  PMH / PSH: reviewed and updated as appropriate   OBJECTIVE:   BP 137/60   Pulse (!) 52   Ht '5\' 3"'$  (1.6 m)   Wt 191 lb 3.2 oz (86.7 kg)   LMP 01/17/2017   SpO2 98%   BMI 33.87 kg/m    GEN: pleasant well appearing female, in no acute distress  CV: regular rate and rhythm RESP: no increased work of breathing, clear to ascultation bilaterally MSK:  left knee exam -Inspection: no deformity, no discoloration -Palpation: medial joint line tenderness -ROM: Extension: 0 degrees; Flexion: 140 degrees.  -Special Tests: Varus Stress: Negative; Valgus Stress: Negative; Anterior drawer: Negative; Posterior drawer: Negative; Thessaly: not attempted; Patellar grind: Negative -Limb neurovascularly intact, no instability noted SKIN: warm, dry, no rash on visible skin PSYCH: Normal affect, appropriate speech and behavior    ASSESSMENT/PLAN:   HYPERTENSION, BENIGN SYSTEMIC Stable.  BP at goal.  Continue lisinopril 20 mg with HCTZ 12.5 mg.  Serum creatinine  stable.  Hyperlipidemia Stable.  Recent LDL at goal.  Refilled Lipitor 80 mg.  Major depressive disorder, recurrent episode (HCC) PHQ-9 score of 10, 0 first question #9.  Doing well with Paxil.  I would like to try different medication in the future as Paxil due to age. Patient not willing to switch at this time.  Prediabetes A1c today 5.8 and previously 5.8.  Well-controlled with diet and exercise.  Suspect previous 9.9 A1c was an outlier.  Left knee pain Suspect osteoarthritis.  Reviewed previous right knee x-rays however number available for the left.  In her right knee she has tricompartmental osteoarthritis more severely affecting the right patellofemoral compartment.  This is nearly identical to where she has pain in her left knee.  On chart review, she has done well with Naprosyn in the past.  Refilled Naprosyn.  Offered x-rays however patient declined.   [name], DO PGY-3, [facility] 07/05/2021

## 2021-07-05 NOTE — Assessment & Plan Note (Signed)
A1c today 5.8 and previously 5.8.  Well-controlled with diet and exercise.  Suspect previous 9.9 A1c was an outlier.

## 2021-07-05 NOTE — Assessment & Plan Note (Signed)
Stable.  Recent LDL at goal.  Refilled Lipitor 80 mg.

## 2021-07-05 NOTE — Progress Notes (Signed)
c 

## 2021-07-05 NOTE — Patient Instructions (Signed)
It was great seeing you today!  Please check-out at the front desk before leaving the clinic. I'd like to see you back in 6 months but if you need to be seen earlier than that for any new issues we're happy to fit you in, just give [place] a call!  Visit Remembers: - Stop by the pharmacy to pick up your prescriptions  - Continue to work on your healthy eating habits and incorporating exercise into your daily life.  - Your goal is to have an BP < 130 / 80 - Medicine Changes: none  Regarding lab work today:  Due to recent changes in healthcare laws, you may see the results of your imaging and laboratory studies on MyChart before your provider has had a chance to review them.  I understand that in some cases there may be results that are confusing or concerning to you. Not all laboratory results come back in the same time frame and you may be waiting for multiple results in order to interpret others.  Please give [place] 72 hours in order for your provider to thoroughly review all the results before contacting the office for clarification of your results. If everything is normal, you will get a letter in the mail or a message in My Chart. Please give [place] a call if you do not hear from [place] after 2 weeks.  Please bring all of your medications with you to each visit.    If you haven't already, sign up for My Chart to have easy access to your labs results, and communication with your primary care physician.  Feel free to call with any questions or concerns at any time, at [phone].   Take care,  Dr. Rushie [facility]

## 2021-08-23 ENCOUNTER — Other Ambulatory Visit: Payer: Self-pay | Admitting: Student

## 2021-08-23 DIAGNOSIS — Z1231 Encounter for screening mammogram for malignant neoplasm of breast: Secondary | ICD-10-CM

## 2021-08-30 ENCOUNTER — Ambulatory Visit: Payer: 59 | Admitting: Student

## 2021-09-14 NOTE — Progress Notes (Unsigned)
{  Annual Exams:28046}     SUBJECTIVE:   CHIEF COMPLAINT / HPI: Knee pain  Has gotten naproxen in past. Has gotten injections in past.  DG Right Knee 2019 Tricompartmental osteoarthritis more severely affecting the patellofemoral compartment. Trace suprapatellar joint effusion. No acute osseous abnormality.  CT RLE 2019 Degenerative disease. Bone-on-bone joint space narrowing between the lateral facet and lateral trochlea  PERTINENT  PMH / PSH: ***  OBJECTIVE:   LMP 01/17/2017   ***  ASSESSMENT/PLAN:   No problem-specific Assessment & Plan notes found for this encounter.     Gerrit Heck, MD [facility]

## 2021-09-15 ENCOUNTER — Ambulatory Visit (INDEPENDENT_AMBULATORY_CARE_PROVIDER_SITE_OTHER): Payer: 59 | Admitting: Student

## 2021-09-15 ENCOUNTER — Encounter: Payer: Self-pay | Admitting: Student

## 2021-09-15 VITALS — BP 116/72 | HR 62 | Wt 194.0 lb

## 2021-09-15 DIAGNOSIS — M25562 Pain in left knee: Secondary | ICD-10-CM

## 2021-09-15 MED ORDER — IBUPROFEN 800 MG PO TABS: 800.0000 mg | ORAL_TABLET | Freq: Three times a day (TID) | ORAL | 0 refills | Status: AC | PRN

## 2021-09-15 NOTE — Patient Instructions (Signed)
It was great to see you! Thank you for allowing me to participate in your care!   I recommend that you always bring your medications to each appointment as this makes it easy to ensure we are on the correct medications and helps [place] not miss when refills are needed.  Our plans for today:  -  ibuprofen 800 mg three times daily for 5 days - Left knee xray at [facility]  Take care and seek immediate care sooner if you develop any concerns. Please remember to show up 15 minutes before your scheduled appointment time!  Gerrit Heck, MD [place]

## 2021-09-15 NOTE — Assessment & Plan Note (Signed)
Suspect most likely component of osteoarthritis with acute strain.  Has not gotten imaging on his left knee before and denies any pain in the right knee currently. -Ibuprofen 800 mg 3 times daily for 5 days (creatinine can tolerate) -DG left knee

## 2021-10-04 ENCOUNTER — Ambulatory Visit: Payer: 59

## 2021-10-26 ENCOUNTER — Ambulatory Visit
Admission: RE | Admit: 2021-10-26 | Discharge: 2021-10-26 | Disposition: A | Discharge status: Home / Self Care | Payer: Commercial Managed Care - HMO | Source: Ambulatory Visit | Attending: *Deleted | Admitting: *Deleted

## 2021-10-26 DIAGNOSIS — Z1231 Encounter for screening mammogram for malignant neoplasm of breast: Secondary | ICD-10-CM

## 2021-11-15 ENCOUNTER — Other Ambulatory Visit: Payer: Self-pay | Admitting: Family Medicine

## 2022-02-18 ENCOUNTER — Other Ambulatory Visit: Payer: Self-pay | Admitting: Family Medicine

## 2022-02-18 DIAGNOSIS — I10 Essential (primary) hypertension: Secondary | ICD-10-CM

## 2022-03-07 ENCOUNTER — Encounter: Payer: Self-pay | Admitting: Student

## 2022-03-07 ENCOUNTER — Ambulatory Visit (INDEPENDENT_AMBULATORY_CARE_PROVIDER_SITE_OTHER): Payer: 59 | Admitting: Student

## 2022-03-07 VITALS — BP 110/65 | HR 67 | Ht 63.0 in | Wt 210.8 lb

## 2022-03-07 DIAGNOSIS — M25562 Pain in left knee: Secondary | ICD-10-CM

## 2022-03-07 DIAGNOSIS — R011 Cardiac murmur, unspecified: Secondary | ICD-10-CM

## 2022-03-07 MED ORDER — METHYLPREDNISOLONE ACETATE 40 MG/ML IJ SUSP
40.0000 mg | Freq: Once | INTRAMUSCULAR | Status: AC
  Administered 2022-03-07: 40 mg via INTRAMUSCULAR

## 2022-03-07 NOTE — Progress Notes (Unsigned)
    SUBJECTIVE:   CHIEF COMPLAINT / HPI:  R Knee Pain Known OA demonstrated on XR in 2019. Saw Jagadish for the same in August of Last year. Had planned on XR but this never happened. 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   New Murmur   PERTINENT  PMH / PSH: ***  OBJECTIVE:   LMP 01/17/2017   ***  ASSESSMENT/PLAN:   No problem-specific Assessment & Plan notes found for this encounter.     Marnee Guarneri, MD [place]

## 2022-03-07 NOTE — Patient Instructions (Addendum)
I have ordered an X-Ray. I want you to go to [facility] on the first floor of [facility], [place] They are open during business hours M-F. You can walk in and have your X-Ray done since I have already ordered it.   We are giving you a steroid shot in the leg. This should give you 3-49month of pain relief. In the meantime, you can work on strengthening the leg. I am giving you a resistance band and some exercises to do. This can help to prevent worsening of the symptoms over time. Though you *may* ultimately need a knee replacement in the future. If you are no better in 3 months, we'll try some physical therapy.  Our clinic staff will call you to get your echocardiogram scheduled for your new murmur.

## 2022-03-08 DIAGNOSIS — R011 Cardiac murmur, unspecified: Secondary | ICD-10-CM | POA: Insufficient documentation

## 2022-03-08 NOTE — Assessment & Plan Note (Signed)
Strongly suspect OA. Needs imaging. Has been doing ROM exercises. Will give her a resistance band and some strengthening exercises to complement her ROM exercises. She is amenable to doing these 1-2x daily.  - DG Knee Left - A steroid injection was performed at L knee using a medial approach using 13m 1% plain Lidocaine and 40 mg of depomedrol. This was well tolerated.

## 2022-03-08 NOTE — Assessment & Plan Note (Signed)
New finding on exam today. Findings c/w aortic stenosis. Thankfully asymptomatic at this time.  - Echocardiogram ordered

## 2022-03-21 ENCOUNTER — Ambulatory Visit
Admission: RE | Admit: 2022-03-21 | Discharge: 2022-03-21 | Disposition: A | Discharge status: Home / Self Care | Payer: 59 | Source: Ambulatory Visit | Attending: Family Medicine | Admitting: Family Medicine

## 2022-03-21 DIAGNOSIS — M25562 Pain in left knee: Secondary | ICD-10-CM

## 2022-04-01 ENCOUNTER — Ambulatory Visit: Payer: Commercial Managed Care - HMO | Admitting: Student

## 2022-04-12 ENCOUNTER — Ambulatory Visit (HOSPITAL_COMMUNITY): Admission: RE | Admit: 2022-04-12 | Payer: 59 | Source: Ambulatory Visit

## 2022-04-29 NOTE — Progress Notes (Signed)
SUBJECTIVE:   Chief compliant/HPI: annual examination  Maris Abascal is a 62 y.o. who presents today for an annual exam.   HTN Patient takes Zestoretic 20-12.5 mg daily.  Has been adherent to this. Denies any headache, vision changes, shortness of breath, lower extremity swelling or chest pain.    Prediabetes Suspect an outlier of A1c 9.9 in 2022.  Says that intermittently drinks liquid IV packets which I discussed to look for low sugar alternatives 4.  Most recent A1Cs:  Lab Results  Component Value Date   HGBA1C 6.0 05/02/2022   HGBA1C 5.8 07/05/2021   HGBA1C 5.8 12/23/2020   Last Microalbumin, LDL, Creatinine: Lab Results  Component Value Date   LDLCALC 158 (H) 09/30/2020   CREATININE 0.72 03/23/2021   Murmur Noticed on previous exam by Dr. Garner Gavel was concerned of aortic stenosis with a crescendo decrescendo pattern.  Patient is asymptomatic does notice that she has to stop during walking but is mostly due to her left knee pain.  She does feel more tired with age.  She denies any chest pain or shortness of breath after exertion.  No lower extremity swelling or orthopnea.  Fatigue She says that she has been fatigued with a.  No specific cause but after work she does not feel like doing anything.  Denies any smoking or alcohol use.  History tabs reviewed and updated.    OBJECTIVE:   BP (!) 138/96   Pulse (!) 50   Temp 98.2 F (36.8 C)   Ht  (1.6 m)   Wt 205 lb 4.8 oz (93.1 kg)   LMP 01/17/2017   SpO2 100%   BMI 36.37 kg/m   General: Well appearing, NAD, awake, alert, responsive to questions Head: Normocephalic atraumatic, no neck masses or thyromegaly CV: Regular rate and rhythm no murmurs rubs or gallops Respiratory: Clear to ausculation bilaterally, no wheezes rales or crackles, chest rises symmetrically,  no increased work of breathing Abdomen: Soft, non-tender, non-distended, normoactive bowel sounds  Extremities: Moves upper and lower  extremities freely, no edema in LE Neuro: No focal deficits Skin: No rashes or lesions visualized   ASSESSMENT/PLAN:   HYPERTENSION, BENIGN SYSTEMIC Continue Zestoretic.  Well-controlled today. -CMP -Lipid panel  Murmur I was unable to auscultate a murmur on my exam today.  Concern for aortic stenosis by previous provider.  Patient is scheduled for echocardiogram in 2 days.  Asymptomatic.  Left medial knee pain Knee x-ray showed degenerative changes.  We discussed exercises that she can do to help.  She said the steroid injection did not help very much last time.  Would like to check her kidney function before prescribing any NSAIDs.  I prescribed her topical NSAID in the meantime.  Hyperlipidemia Refilled atorvastatin 80 mg -Lipid panel  Fatigue Etiology unclear check labs today to assess this. -TSH -CBC -BMP  Prediabetes Believes she had an outlier of 9.9 in 2022.  Today it is at 6.0 in the prediabetes range.   -We will recheck in 3 months     Annual Examination  See AVS for age appropriate recommendations  PHQ score 7, reviewed and discussed.  BP reviewed and at goal.   Considered the following items based upon USPSTF recommendations: Diabetes screening: ordered Screening for elevated cholesterol: ordered  Osteoporosis screening considered based upon risk of fracture from [facility] calculator. Major osteoporotic fracture risk is 12%. DEXA not ordered.   Colorectal cancer screening: up to date on screening for CRC. Last scope was in  2022-3 polyps removed (tubular adenoma and benign hyperplastic-7 year repeat recommended) Lung cancer screening:  not indicated . See documentation below regarding indications/risks/benefits.   Follow up in 1 year or sooner if indicated.   Levin Erp, MD [facility]

## 2022-05-02 ENCOUNTER — Encounter: Payer: Self-pay | Admitting: Student

## 2022-05-02 ENCOUNTER — Ambulatory Visit (INDEPENDENT_AMBULATORY_CARE_PROVIDER_SITE_OTHER): Payer: 59 | Admitting: Student

## 2022-05-02 VITALS — BP 138/96 | HR 50 | Temp 98.2°F | Ht 63.0 in | Wt 205.3 lb

## 2022-05-02 DIAGNOSIS — R7303 Prediabetes: Secondary | ICD-10-CM | POA: Diagnosis not present

## 2022-05-02 DIAGNOSIS — M25561 Pain in right knee: Secondary | ICD-10-CM

## 2022-05-02 DIAGNOSIS — R011 Cardiac murmur, unspecified: Secondary | ICD-10-CM

## 2022-05-02 DIAGNOSIS — E785 Hyperlipidemia, unspecified: Secondary | ICD-10-CM

## 2022-05-02 DIAGNOSIS — I10 Essential (primary) hypertension: Secondary | ICD-10-CM

## 2022-05-02 DIAGNOSIS — R5383 Other fatigue: Secondary | ICD-10-CM

## 2022-05-02 DIAGNOSIS — G8929 Other chronic pain: Secondary | ICD-10-CM

## 2022-05-02 DIAGNOSIS — M25562 Pain in left knee: Secondary | ICD-10-CM

## 2022-05-02 DIAGNOSIS — F331 Major depressive disorder, recurrent, moderate: Secondary | ICD-10-CM

## 2022-05-02 LAB — POCT GLYCOSYLATED HEMOGLOBIN (HGB A1C): HbA1c, POC (controlled diabetic range): 6 % (ref 0.0–7.0)

## 2022-05-02 MED ORDER — ATORVASTATIN CALCIUM 80 MG PO TABS
80.00 mg | ORAL_TABLET | Freq: Every day | ORAL | 1 refills | Status: DC
Start: 2022-05-02 — End: 2022-07-22

## 2022-05-02 MED ORDER — LISINOPRIL-HYDROCHLOROTHIAZIDE 20-12.5 MG PO TABS: 1.0000 | ORAL_TABLET | Freq: Every day | ORAL | 0 refills | Status: DC

## 2022-05-02 MED ORDER — HYDROXYZINE HCL 10 MG PO TABS: ORAL_TABLET | ORAL | 0 refills | Status: DC

## 2022-05-02 MED ORDER — PAROXETINE HCL 30 MG PO TABS
30.0000 mg | ORAL_TABLET | Freq: Every day | ORAL | 1 refills | Status: DC
Start: 2022-05-02 — End: 2022-07-22

## 2022-05-02 MED ORDER — DICLOFENAC SODIUM 1 % EX GEL: 2.0000 g | Freq: Four times a day (QID) | CUTANEOUS | 1 refills | Status: DC

## 2022-05-02 NOTE — Assessment & Plan Note (Signed)
Believes she had an outlier of 9.9 in 2022.  Today it is at 6.0 in the prediabetes range.   -We will recheck in 3 months

## 2022-05-02 NOTE — Assessment & Plan Note (Signed)
I was unable to auscultate a murmur on my exam today.  Concern for aortic stenosis by previous provider.  Patient is scheduled for echocardiogram in 2 days.  Asymptomatic.

## 2022-05-02 NOTE — Assessment & Plan Note (Signed)
Refilled atorvastatin 80 mg -Lipid panel

## 2022-05-02 NOTE — Assessment & Plan Note (Signed)
Knee x-ray showed degenerative changes.  We discussed exercises that she can do to help.  She said the steroid injection did not help very much last time.  Would like to check her kidney function before prescribing any NSAIDs.  I prescribed her topical NSAID in the meantime.

## 2022-05-02 NOTE — Assessment & Plan Note (Signed)
Etiology unclear check labs today to assess this. -TSH -CBC -BMP

## 2022-05-02 NOTE — Assessment & Plan Note (Signed)
Continue Zestoretic.  Well-controlled today. -CMP -Lipid panel

## 2022-05-02 NOTE — Patient Instructions (Signed)
It was great to see you! Thank you for allowing me to participate in your care!   I recommend that you always bring your medications to each appointment as this makes it easy to ensure we are on the correct medications and helps [place] not miss when refills are needed.  Our plans for today:  -Lets follow-up in 3 months to repeat your A1c -We will check some labs today-if your knee pain is not improving in a couple weeks come back in and we can discuss -I have refilled all your medications today  We are checking some labs today, I will call you if they are abnormal will send you a MyChart message or a letter if they are normal.  If you do not hear about your labs in the next 2 weeks please let us know.  Take care and seek immediate care sooner if you develop any concerns. Please remember to show up 15 minutes before your scheduled appointment time!  Levin Erp, MD [facility]

## 2022-05-03 LAB — CBC
Hematocrit: 34.1 % (ref 34.0–46.6)
Hemoglobin: 10.9 g/dL — ABNORMAL LOW (ref 11.1–15.9)
MCH: 27.7 pg (ref 26.6–33.0)
MCHC: 32 g/dL (ref 31.5–35.7)
MCV: 87 fL (ref 79–97)
Platelets: 253 10*3/uL (ref 150–450)
RBC: 3.94 x10E6/uL (ref 3.77–5.28)
RDW: 13.1 % (ref 11.7–15.4)
WBC: 5.7 10*3/uL (ref 3.4–10.8)

## 2022-05-03 LAB — COMPREHENSIVE METABOLIC PANEL
ALT: 10 IU/L (ref 0–32)
AST: 13 IU/L (ref 0–40)
Albumin/Globulin Ratio: 1.5 (ref 1.2–2.2)
Albumin: 4 g/dL (ref 3.9–4.9)
Alkaline Phosphatase: 103 IU/L (ref 44–121)
BUN/Creatinine Ratio: 24 (ref 12–28)
BUN: 16 mg/dL (ref 8–27)
Bilirubin Total: 0.4 mg/dL (ref 0.0–1.2)
CO2: 23 mmol/L (ref 20–29)
Calcium: 9 mg/dL (ref 8.7–10.3)
Chloride: 105 mmol/L (ref 96–106)
Creatinine, Ser: 0.67 mg/dL (ref 0.57–1.00)
Globulin, Total: 2.7 g/dL (ref 1.5–4.5)
Glucose: 93 mg/dL (ref 70–99)
Potassium: 4.2 mmol/L (ref 3.5–5.2)
Sodium: 141 mmol/L (ref 134–144)
Total Protein: 6.7 g/dL (ref 6.0–8.5)
eGFR: 99 mL/min/{1.73_m2} (ref >59)

## 2022-05-03 LAB — LIPID PANEL
Chol/HDL Ratio: 3.2 ratio (ref 0.0–4.4)
Cholesterol, Total: 161 mg/dL (ref 100–199)
HDL: 50 mg/dL (ref >39)
LDL Chol Calc (NIH): 98 mg/dL (ref 0–99)
Triglycerides: 66 mg/dL (ref 0–149)
VLDL Cholesterol Cal: 13 mg/dL (ref 5–40)

## 2022-05-03 LAB — TSH RFX ON ABNORMAL TO FREE T4: TSH: 0.397 u[IU]/mL — ABNORMAL LOW (ref 0.450–4.500)

## 2022-05-03 LAB — T4F: T4,Free (Direct): 0.93 ng/dL (ref 0.82–1.77)

## 2022-05-04 ENCOUNTER — Ambulatory Visit (HOSPITAL_COMMUNITY)
Admission: RE | Admit: 2022-05-04 | Discharge: 2022-05-04 | Disposition: A | Discharge status: Home / Self Care | Payer: 59 | Source: Ambulatory Visit | Attending: Family Medicine | Admitting: Family Medicine

## 2022-05-04 DIAGNOSIS — I10 Essential (primary) hypertension: Secondary | ICD-10-CM | POA: Insufficient documentation

## 2022-05-04 DIAGNOSIS — R011 Cardiac murmur, unspecified: Secondary | ICD-10-CM | POA: Diagnosis not present

## 2022-05-04 DIAGNOSIS — E785 Hyperlipidemia, unspecified: Secondary | ICD-10-CM | POA: Diagnosis not present

## 2022-05-04 NOTE — Progress Notes (Signed)
Echocardiogram 2D Echocardiogram has been performed.  [name] 05/04/2022, 3:36 PM

## 2022-05-05 ENCOUNTER — Encounter: Payer: Self-pay | Admitting: Student

## 2022-05-09 LAB — ECHOCARDIOGRAM COMPLETE
Area-P 1/2: 6.12 cm2
Calc EF: 74.4 %
S' Lateral: 2.4 cm
Single Plane A2C EF: 74.3 %
Single Plane A4C EF: 74.3 %

## 2022-07-22 ENCOUNTER — Ambulatory Visit (INDEPENDENT_AMBULATORY_CARE_PROVIDER_SITE_OTHER): Payer: 59 | Admitting: Student

## 2022-07-22 ENCOUNTER — Encounter: Payer: Self-pay | Admitting: Student

## 2022-07-22 VITALS — BP 139/94 | HR 50 | Ht 63.0 in | Wt 199.4 lb

## 2022-07-22 DIAGNOSIS — M25562 Pain in left knee: Secondary | ICD-10-CM

## 2022-07-22 DIAGNOSIS — G8929 Other chronic pain: Secondary | ICD-10-CM

## 2022-07-22 DIAGNOSIS — R7989 Other specified abnormal findings of blood chemistry: Secondary | ICD-10-CM | POA: Diagnosis not present

## 2022-07-22 DIAGNOSIS — E785 Hyperlipidemia, unspecified: Secondary | ICD-10-CM

## 2022-07-22 DIAGNOSIS — I10 Essential (primary) hypertension: Secondary | ICD-10-CM

## 2022-07-22 DIAGNOSIS — F331 Major depressive disorder, recurrent, moderate: Secondary | ICD-10-CM | POA: Diagnosis not present

## 2022-07-22 DIAGNOSIS — M25561 Pain in right knee: Secondary | ICD-10-CM

## 2022-07-22 MED ORDER — LISINOPRIL-HYDROCHLOROTHIAZIDE 20-12.5 MG PO TABS
1.0000 | ORAL_TABLET | Freq: Every day | ORAL | 0 refills | Status: DC
Start: 2022-07-22 — End: 2023-01-04

## 2022-07-22 MED ORDER — ATORVASTATIN CALCIUM 80 MG PO TABS
80.0000 mg | ORAL_TABLET | Freq: Every day | ORAL | 1 refills | Status: DC
Start: 2022-07-22 — End: 2023-06-01

## 2022-07-22 MED ORDER — DICLOFENAC SODIUM 1 % EX GEL
2.0000 g | Freq: Four times a day (QID) | CUTANEOUS | 1 refills | Status: AC
Start: 2022-07-22 — End: ?

## 2022-07-22 MED ORDER — HYDROXYZINE HCL 10 MG PO TABS
ORAL_TABLET | ORAL | 0 refills | Status: DC
Start: 2022-07-22 — End: 2023-08-24

## 2022-07-22 MED ORDER — PAROXETINE HCL 30 MG PO TABS
30.0000 mg | ORAL_TABLET | Freq: Every day | ORAL | 1 refills | Status: DC
Start: 2022-07-22 — End: 2023-02-13

## 2022-07-22 NOTE — Assessment & Plan Note (Signed)
TSH low with normal T4 at last visit. Will recheck with T3 today. Has some sweating and fatigue but denies any palpitations.

## 2022-07-22 NOTE — Patient Instructions (Signed)
It was great to see you! Thank you for allowing me to participate in your care!   I recommend that you always bring your medications to each appointment as this makes it easy to ensure we are on the correct medications and helps [place] not miss when refills are needed.  Our plans for today:  - I am referring you to sports medicine about your knee - I refilled your meds  We are checking some labs today, I will call you if they are abnormal will send you a MyChart message or a letter if they are normal.  If you do not hear about your labs in the next 2 weeks please let us know.  Take care and seek immediate care sooner if you develop any concerns. Please remember to show up 15 minutes before your scheduled appointment time!  Levin Erp, MD [facility]

## 2022-07-22 NOTE — Assessment & Plan Note (Signed)
Controlled today -Continue zestoretic 20-12.5 mg daily

## 2022-07-22 NOTE — Assessment & Plan Note (Signed)
No instability on examination however she does have positive Thessaly.  Discussed with her physical therapy/sports medicine referral.  She is amenable to sports medicine referral today. -Sports medicine referral -Strengthening exercises -Consider reintroducing PT

## 2022-07-22 NOTE — Progress Notes (Signed)
    SUBJECTIVE:   CHIEF COMPLAINT / HPI:   TSH Low TSH .397 at last visit and T4 normal at 0.93  Hypertension: Patient is a 62 y.o. female who present today for follow up of hypertension.   Patient endorses no problems  Home medications include: Zestoretic 20-12.5 mg daily Patient endorses taking these medications as prescribed. Denies any headache, vision changes, shortness of breath, lower extremity swelling or chest pain   Most recent creatinine trend:  Lab Results  Component Value Date   CREATININE 0.67 05/02/2022   CREATININE 0.72 03/23/2021   CREATININE 0.69 09/30/2020   Left medial knee pain Chronic and has been ongoing for many months now.  Got knee x-ray which showed degenerative changes.  She denies any preceding trauma to this.  She has been taking Tylenol arthritis as needed and using Voltaren gel which helps somewhat but has not completely relieved her pain.  Has not been doing many strengthening exercises.  PERTINENT  PMH / PSH: Reviewed  OBJECTIVE:   BP (!) 139/94   Pulse (!) 50   Ht 5\' 3"  (1.6 m)   Wt 199 lb 6 oz (90.4 kg)   LMP 01/17/2017   SpO2 100%   BMI 35.32 kg/m   General: Well appearing, NAD, awake, alert, responsive to questions Head: Normocephalic atraumatic CV: Regular rate and rhythm no murmurs rubs or gallops Respiratory: Clear to ausculation bilaterally, no wheezes rales or crackles, chest rises symmetrically,  no increased work of breathing Extremities: Moves upper and lower extremities freely, no edema in LE Knee Exam -Inspection: no deformity, no discoloration -Palpation: L sided medial joint line tenderness -Special Tests:Negative Anterior/Posterior drawer, Thessaly-unable to stand on left leg -Limb neurovascularly intact, no instability noted  ASSESSMENT/PLAN:   Abnormal TSH TSH low with normal T4 at last visit. Will recheck with T3 today. Has some sweating and fatigue but denies any palpitations.   HYPERTENSION, BENIGN  SYSTEMIC Controlled today -Continue zestoretic 20-12.5 mg daily  Left medial knee pain No instability on examination however she does have positive Thessaly.  Discussed with her physical therapy/sports medicine referral.  She is amenable to sports medicine referral today. -Sports medicine referral -Strengthening exercises -Consider reintroducing PT   Levin Erp, MD [facility]

## 2022-07-23 LAB — TSH RFX ON ABNORMAL TO FREE T4: TSH: 0.221 u[IU]/mL — ABNORMAL LOW (ref 0.450–4.500)

## 2022-07-23 LAB — T4F: T4,Free (Direct): 0.94 ng/dL (ref 0.82–1.77)

## 2022-07-23 LAB — T3, FREE: T3, Free: 2.9 pg/mL (ref 2.0–4.4)

## 2022-07-26 ENCOUNTER — Other Ambulatory Visit: Payer: Self-pay | Admitting: Family Medicine

## 2022-07-28 ENCOUNTER — Telehealth: Payer: Self-pay | Admitting: Student

## 2022-07-28 ENCOUNTER — Telehealth: Payer: Self-pay

## 2022-07-28 ENCOUNTER — Encounter: Payer: Self-pay | Admitting: Student

## 2022-07-28 NOTE — Telephone Encounter (Signed)
Attempted to reach patient to make appt to discuss lab results. No answer. Went to voicemail that has not been set up yet. Unable to LVM. Aquilla Solian, CMA

## 2022-07-28 NOTE — Telephone Encounter (Signed)
Called patient x 3 but no answer and no voicemail set up. She does not have mychart either. Alternate contact no answer either or option to leave VM-just kept ringing. Will send letter to patient.  Discussed results with attending Dr. McDiarmid.  Given subclinical hyperthyroidism algorithm recommends obtaining TSH receptor antibodies and radionuclide scan to see if there is a hot nodule.  Will send letter about results.  Will ask team to schedule patient to discuss this if possible.

## 2022-07-28 NOTE — Telephone Encounter (Signed)
-----   Message from [facility] sent at 07/28/2022  8:35 AM EDT ----- I was unable to get through to her but if able can you schedule patient for discussing lab results?

## 2022-08-09 NOTE — Telephone Encounter (Signed)
2nd attempt to call patient. No answer. Went straight to voicemail that has not been set up.  Aquilla Solian, CMA

## 2022-08-25 ENCOUNTER — Ambulatory Visit: Payer: 59 | Admitting: Student

## 2022-09-05 ENCOUNTER — Ambulatory Visit: Payer: 59 | Admitting: Student

## 2022-09-05 ENCOUNTER — Encounter: Payer: Self-pay | Admitting: Student

## 2022-09-05 ENCOUNTER — Ambulatory Visit (INDEPENDENT_AMBULATORY_CARE_PROVIDER_SITE_OTHER): Payer: 59 | Admitting: Student

## 2022-09-05 VITALS — BP 114/78 | HR 61 | Ht 63.0 in | Wt 202.2 lb

## 2022-09-05 DIAGNOSIS — R7989 Other specified abnormal findings of blood chemistry: Secondary | ICD-10-CM

## 2022-09-05 NOTE — Progress Notes (Signed)
    SUBJECTIVE:   CHIEF COMPLAINT / HPI: Low TSH   Patient has chronically low TSH levels We checked a T4 and T3 at last visit which were normal States that she is very tired Sometimes sweaty been feeling hot, sometimes clammy skin Denies any diarrhea or constipation No weight loss-has gained 3 pounds since last visit Had 1 episode of heart racing/palpitations-lasted for 5-6 minutes-no CP or shortness of breath with this No neck pain or masses  PERTINENT  PMH / PSH: HTN  OBJECTIVE:   BP 114/78   Pulse 61   Ht 5\' 3"  (1.6 m)   Wt 202 lb 4 oz (91.7 kg)   LMP 01/17/2017   SpO2 100%   BMI 35.83 kg/m   General: Well appearing, NAD, awake, alert, responsive to questions Head: Normocephalic atraumatic, no thyromegaly, no masses or nodules palpated, no lymph nodes CV: Regular rate and rhythm no murmurs rubs or gallops Respiratory: Clear to ausculation bilaterally, no wheezes rales or crackles, chest rises symmetrically,  no increased work of breathing Extremities: Moves upper and lower extremities freely, no edema in LE  ASSESSMENT/PLAN:   Low TSH level Chronically low TSH level with normal T4 and T3 level.  Given patient is symptomatic with fatigue, sweating, 1 episode of heart racing will refer to endocrinology for further workup and management. -Discussed with pt to reach out to me if starting to have increased palpitations, at this time could consider Zio patch and/or low-dose beta-blocker   Levin Erp, MD [facility]

## 2022-09-05 NOTE — Patient Instructions (Signed)
It was great to see you! Thank you for allowing me to participate in your care!   Our plans for today:  -I am placing a referral for an endocrinologist to evaluate you given your symptoms -If you start having more palpitations or heart racing please let us know and we can consider doing a heart monitor or starting a low-dose of a beta-blocker to help with this  Take care and seek immediate care sooner if you develop any concerns.  Levin Erp, MD

## 2022-09-05 NOTE — Assessment & Plan Note (Signed)
Chronically low TSH level with normal T4 and T3 level.  Given patient is symptomatic with fatigue, sweating, 1 episode of heart racing will refer to endocrinology for further workup and management. -Discussed with pt to reach out to me if starting to have increased palpitations, at this time could consider Zio patch and/or low-dose beta-blocker

## 2022-11-11 ENCOUNTER — Telehealth: Payer: Self-pay | Admitting: Student

## 2022-11-11 NOTE — Telephone Encounter (Signed)
Attempted to call patient x 3 after letter stating she has not heard anything from endocrinologist and unable to set up her breast center screening.  No voicemail set up at all.  I will send patient a letter to please schedule an appointment and provide a good callback number.  I see that the endocrinology referral has been closed out likely in the setting of not being able to contact patient via phone.

## 2022-11-11 NOTE — Telephone Encounter (Signed)
error 

## 2023-01-03 ENCOUNTER — Other Ambulatory Visit: Payer: Self-pay | Admitting: Student

## 2023-01-03 DIAGNOSIS — I10 Essential (primary) hypertension: Secondary | ICD-10-CM

## 2023-02-12 ENCOUNTER — Other Ambulatory Visit: Payer: Self-pay | Admitting: Student

## 2023-02-12 DIAGNOSIS — F331 Major depressive disorder, recurrent, moderate: Secondary | ICD-10-CM

## 2023-03-14 ENCOUNTER — Other Ambulatory Visit: Payer: Self-pay | Admitting: Family Medicine

## 2023-03-14 ENCOUNTER — Encounter: Payer: Self-pay | Admitting: Student

## 2023-03-14 DIAGNOSIS — Z1231 Encounter for screening mammogram for malignant neoplasm of breast: Secondary | ICD-10-CM

## 2023-04-04 ENCOUNTER — Ambulatory Visit: Payer: 59

## 2023-04-04 ENCOUNTER — Ambulatory Visit: Payer: 59 | Admitting: Student

## 2023-04-21 ENCOUNTER — Other Ambulatory Visit: Payer: Self-pay | Admitting: Family Medicine

## 2023-04-21 DIAGNOSIS — M79605 Pain in left leg: Secondary | ICD-10-CM

## 2023-04-25 ENCOUNTER — Encounter: Payer: Self-pay | Admitting: Student

## 2023-04-25 ENCOUNTER — Ambulatory Visit

## 2023-04-25 ENCOUNTER — Ambulatory Visit: Payer: Self-pay | Admitting: Student

## 2023-04-25 VITALS — BP 111/76 | HR 62 | Ht 63.0 in | Wt 204.2 lb

## 2023-04-25 DIAGNOSIS — R7989 Other specified abnormal findings of blood chemistry: Secondary | ICD-10-CM | POA: Diagnosis not present

## 2023-04-25 DIAGNOSIS — Z23 Encounter for immunization: Secondary | ICD-10-CM

## 2023-04-25 DIAGNOSIS — M62838 Other muscle spasm: Secondary | ICD-10-CM | POA: Diagnosis not present

## 2023-04-25 MED ORDER — TIZANIDINE HCL 4 MG PO TABS
4.0000 mg | ORAL_TABLET | Freq: Three times a day (TID) | ORAL | 0 refills | Status: DC | PRN
Start: 2023-04-25 — End: 2023-06-28

## 2023-04-25 NOTE — Patient Instructions (Addendum)
 It was great to see you! Thank you for allowing me to participate in your care!   Our plans for today:  - I am checking thyroid function again and will leave voicemail with details  - I am sending in muscle relaxer for muscle spasm  Take care and seek immediate care sooner if you develop any concerns.  Levin Erp, MD                              Contains text generated by [employer]bridge.                          Contains text generated by [employer]bridge.

## 2023-04-25 NOTE — Assessment & Plan Note (Signed)
 Hx of low TSH. Was unable to get in contact with pt last year. She confirms same phone number today. No palpitations.  - TSH, T3, TrAb

## 2023-04-25 NOTE — Progress Notes (Signed)
    SUBJECTIVE:   CHIEF COMPLAINT / HPI: Spasms  Discussed the use of AI scribe software for clinical note transcription with the patient, who gave verbal consent to proceed.  History of Present Illness Presents with right leg muscle spasms. The spasms are located in the upper leg and are not associated with any known injury. The patient reports that the spasms are most severe in the morning upon waking and can also be triggered by physical activity. The spasms are described as a throbbing pain, which is partially relieved by over-the-counter Tylenol. The patient denies any radiating pain or changes in bowel movements or sensation/strength.   She reports a recent episode of black stool, which resolved after a single occurrence and was related to Pepto Bismol use.  In addition to the leg spasms, the patient reports ongoing fatigue and occasional sweating, potentially related to her known thyroid dysfunction. She denies any palpitations or other symptoms of hyperthyroidism.   PERTINENT  PMH / PSH: htn, hld, mdd  OBJECTIVE:   BP 111/76   Pulse 62   Ht 5\' 3"  (1.6 m)   Wt 204 lb 3.2 oz (92.6 kg)   LMP 01/17/2017   SpO2 100%   BMI 36.17 kg/m   General: Well appearing, NAD, awake, alert, responsive to questions Head: Normocephalic atraumatic Respiratory: chest rises symmetrically,  no increased work of breathing Extremities: Moves upper and lower extremities freely, some pain with extension, flexion of right leg, strength 5/5, gait normal, sensation intact, no pain on palpation of upper thigh (tight feeling with movements)  ASSESSMENT/PLAN:   Assessment & Plan Muscle spasm Intermittent spasms in the right leg, possibly a muscle straing - trial tizanidine PRN Low TSH level Hx of low TSH. Was unable to get in contact with pt last year. She confirms same phone number today. No palpitations.  - TSH, T3, TrAb   General Health Maintenance Recent dark stool x1  likely from bismuth  subsalicylate, no recurrence. UTD on colonoscopy. - Advise to report recurrence.  Levin Erp, MD [facility]

## 2023-04-27 LAB — THYROTROPIN RECEPTOR AUTOABS

## 2023-04-27 LAB — TSH RFX ON ABNORMAL TO FREE T4: TSH: 0.287 u[IU]/mL — ABNORMAL LOW (ref 0.450–4.500)

## 2023-04-27 LAB — T4F: T4,Free (Direct): 0.96 ng/dL (ref 0.82–1.77)

## 2023-04-27 LAB — T3, FREE: T3, Free: 3.1 pg/mL (ref 2.0–4.4)

## 2023-05-03 ENCOUNTER — Telehealth: Payer: Self-pay | Admitting: Student

## 2023-05-03 NOTE — Addendum Note (Signed)
 Addended by: Ashlinn Hemrick on: 05/03/2023 12:18 PM   Modules accepted: Orders

## 2023-05-03 NOTE — Telephone Encounter (Signed)
 Called pt x 3 no VM or response. Will send letter, I am putting in endocrinology referral as discussed at our visit for her low TSH

## 2023-05-04 ENCOUNTER — Ambulatory Visit: Admitting: Student

## 2023-05-04 ENCOUNTER — Ambulatory Visit

## 2023-06-01 ENCOUNTER — Ambulatory Visit: Payer: Self-pay | Admitting: Student

## 2023-06-01 ENCOUNTER — Ambulatory Visit (INDEPENDENT_AMBULATORY_CARE_PROVIDER_SITE_OTHER): Admitting: Student

## 2023-06-01 ENCOUNTER — Ambulatory Visit: Payer: Self-pay

## 2023-06-01 VITALS — BP 117/86 | HR 55 | Ht 63.0 in | Wt 196.0 lb

## 2023-06-01 DIAGNOSIS — Z5971 Insufficient health insurance coverage: Secondary | ICD-10-CM | POA: Diagnosis not present

## 2023-06-01 DIAGNOSIS — R7303 Prediabetes: Secondary | ICD-10-CM | POA: Diagnosis not present

## 2023-06-01 DIAGNOSIS — E785 Hyperlipidemia, unspecified: Secondary | ICD-10-CM

## 2023-06-01 DIAGNOSIS — Z113 Encounter for screening for infections with a predominantly sexual mode of transmission: Secondary | ICD-10-CM

## 2023-06-01 DIAGNOSIS — Z23 Encounter for immunization: Secondary | ICD-10-CM

## 2023-06-01 DIAGNOSIS — M25511 Pain in right shoulder: Secondary | ICD-10-CM

## 2023-06-01 LAB — POCT GLYCOSYLATED HEMOGLOBIN (HGB A1C): HbA1c, POC (prediabetic range): 5.7 % (ref 5.7–6.4)

## 2023-06-01 MED ORDER — NAPROXEN 500 MG PO TABS
500.0000 mg | ORAL_TABLET | Freq: Two times a day (BID) | ORAL | 0 refills | Status: AC
Start: 2023-06-01 — End: 2023-06-06

## 2023-06-01 MED ORDER — ATORVASTATIN CALCIUM 80 MG PO TABS
80.0000 mg | ORAL_TABLET | Freq: Every day | ORAL | 1 refills | Status: AC
Start: 2023-06-01 — End: 2023-11-28

## 2023-06-01 MED ORDER — ZOSTER VAC RECOMB ADJUVANTED 50 MCG/0.5ML IM SUSR
0.5000 mL | Freq: Once | INTRAMUSCULAR | 0 refills | Status: AC
Start: 2023-06-01 — End: 2023-06-01

## 2023-06-01 NOTE — Patient Instructions (Signed)
 It was great to see you! Thank you for allowing me to participate in your care!   Our plans for today:  - Referral sent to: Please call to set up appointment [facility] [address] - [phone] - You need a mammogram to screen for breast cancer.  Please schedule an appointment.  You can call [phone].    - I have printed a shingles vaccine for you  - Please stop at front desk for mychart set up - I will send letter with your results Today at your annual preventive visit we talked about the following measures:   I recommend 150 minutes of exercise per week-try 30 minutes 5 days per week We discussed reducing sugary beverages (like soda and juice) and increasing leafy greens and whole fruits.  We discussed avoiding tobacco and alcohol.  I recommend avoiding illicit substances.  Your blood pressure is  at goal.    Take care and seek immediate care sooner if you develop any concerns.  Genora Kidd, MD

## 2023-06-01 NOTE — Assessment & Plan Note (Addendum)
 A1c ordered, stable at 5.7
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
## 2023-06-02 LAB — CBC
Hematocrit: 35.6 % (ref 34.0–46.6)
Hemoglobin: 11.4 g/dL (ref 11.1–15.9)
MCH: 27.1 pg (ref 26.6–33.0)
MCHC: 32 g/dL (ref 31.5–35.7)
MCV: 85 fL (ref 79–97)
Platelets: 263 10*3/uL (ref 150–450)
RBC: 4.21 x10E6/uL (ref 3.77–5.28)
RDW: 13.1 % (ref 11.7–15.4)
WBC: 5.9 10*3/uL (ref 3.4–10.8)

## 2023-06-02 LAB — RPR: RPR Ser Ql: NONREACTIVE

## 2023-06-02 LAB — BASIC METABOLIC PANEL WITH GFR
BUN/Creatinine Ratio: 16 (ref 12–28)
BUN: 12 mg/dL (ref 8–27)
CO2: 23 mmol/L (ref 20–29)
Calcium: 9.2 mg/dL (ref 8.7–10.3)
Chloride: 100 mmol/L (ref 96–106)
Creatinine, Ser: 0.75 mg/dL (ref 0.57–1.00)
Glucose: 66 mg/dL — ABNORMAL LOW (ref 70–99)
Potassium: 4.3 mmol/L (ref 3.5–5.2)
Sodium: 141 mmol/L (ref 134–144)
eGFR: 90 mL/min/{1.73_m2} (ref >59)

## 2023-06-02 LAB — LIPID PANEL
Chol/HDL Ratio: 4.2 ratio (ref 0.0–4.4)
Cholesterol, Total: 200 mg/dL — ABNORMAL HIGH (ref 100–199)
HDL: 48 mg/dL (ref >39)
LDL Chol Calc (NIH): 141 mg/dL — ABNORMAL HIGH (ref 0–99)
Triglycerides: 63 mg/dL (ref 0–149)
VLDL Cholesterol Cal: 11 mg/dL (ref 5–40)

## 2023-06-02 LAB — HIV ANTIBODY (ROUTINE TESTING W REFLEX): HIV Screen 4th Generation wRfx: NONREACTIVE

## 2023-06-02 LAB — HCV INTERPRETATION

## 2023-06-02 LAB — HCV AB W REFLEX TO QUANT PCR: HCV Ab: NONREACTIVE

## 2023-06-06 ENCOUNTER — Telehealth: Payer: Self-pay | Admitting: *Deleted

## 2023-06-06 NOTE — Progress Notes (Unsigned)
 Complex Care Management Note Care Guide Note  06/06/2023 Name: Amber Willis MRN: [id number] DOB: 30-Apr-1960   Complex Care Management Outreach Attempts: An unsuccessful telephone outreach was attempted today to offer the patient information about available complex care management services.  Follow Up Plan:  Additional outreach attempts will be made to offer the patient complex care management information and services.   Encounter Outcome:  No Answer  Barnie Bora  [facility]  [facility], [facility] Guide  Direct Dial: [phone]  Fax [phone]

## 2023-06-09 ENCOUNTER — Other Ambulatory Visit: Payer: Self-pay | Admitting: Student

## 2023-06-09 DIAGNOSIS — I10 Essential (primary) hypertension: Secondary | ICD-10-CM

## 2023-06-09 DIAGNOSIS — F331 Major depressive disorder, recurrent, moderate: Secondary | ICD-10-CM

## 2023-06-14 NOTE — Progress Notes (Unsigned)
 Complex Care Management Note Care Guide Note  06/14/2023 Name: Amber Willis MRN: [id number] DOB: Jun 13, 1960   Complex Care Management Outreach Attempts: A second unsuccessful outreach was attempted today to offer the patient with information about available complex care management services.  Follow Up Plan:  Additional outreach attempts will be made to offer the patient complex care management information and services.   Encounter Outcome:  No Answer  Creola Doheny [facility], [facility] Guide  Direct Dial: [phone]  Fax [phone]

## 2023-06-16 ENCOUNTER — Ambulatory Visit
Admission: RE | Admit: 2023-06-16 | Discharge: 2023-06-16 | Disposition: A | Discharge status: Home / Self Care | Source: Ambulatory Visit | Attending: Family Medicine | Admitting: Family Medicine

## 2023-06-16 DIAGNOSIS — M25511 Pain in right shoulder: Secondary | ICD-10-CM

## 2023-06-16 NOTE — Progress Notes (Signed)
 Complex Care Management Note  Care Guide Note 06/16/2023 Name: Amber Willis MRN: [id number] DOB: 14-Aug-1960  Amber Willis is a 63 y.o. year old female who sees Genora Kidd, MD for primary care. I reached out to Leeanne Puffer Sayer by phone today to offer complex care management services.  [name] was given information about Complex Care Management services today including:   The Complex Care Management services include support from the care team which includes your Nurse Care Manager, Clinical Social Worker, or Pharmacist.  The Complex Care Management team is here to help remove barriers to the health concerns and goals most important to you. Complex Care Management services are voluntary, and the patient may decline or stop services at any time by request to their care team member.   Complex Care Management Consent Status: Patient agreed to services and verbal consent obtained.   Follow up plan:  Telephone appointment with complex care management team member scheduled for:  06/22/23  Encounter Outcome:  Patient Scheduled  Barnie Bora  [facility] Health  [facility], [facility] Guide  Direct Dial: [phone]  Fax [phone]

## 2023-06-17 ENCOUNTER — Other Ambulatory Visit: Payer: Self-pay | Admitting: Student

## 2023-06-17 DIAGNOSIS — M62838 Other muscle spasm: Secondary | ICD-10-CM

## 2023-06-22 ENCOUNTER — Other Ambulatory Visit: Payer: Self-pay

## 2023-06-22 ENCOUNTER — Other Ambulatory Visit: Payer: Self-pay | Admitting: Student

## 2023-06-22 DIAGNOSIS — M62838 Other muscle spasm: Secondary | ICD-10-CM

## 2023-06-22 NOTE — Patient Instructions (Signed)
 Visit Information  Amber Willis was given information about Medicaid Managed Care team care coordination services as a part of their [employer] Medicaid benefit. Amber Willis verbally consentedto engagement with the [facility] Managed Care team.   If you are experiencing a medical emergency, please call 911 or report to your local emergency department or urgent care.   If you have a non-emergency medical problem during routine business hours, please contact your provider's office and ask to speak with a nurse.   For questions related to your [employer] [facility] health plan, please call: [phone]  OR visit the member homepage at: [email or web address]  If you would like to schedule transportation through your [employer] [facility] plan, please call the following number at least 2 days in advance of your appointment: [phone]  If you are experiencing a behavioral health crisis, call the [employer] [place]  Behavioral Health Crisis Line at 1-682-431-4464 [phone]). The line is available 24 hours a day, seven days a week.  If you would like help to quit smoking, call 1-800-QUIT-NOW (2627596468) OR Espaol: 1-855-Djelo-Ya (1-884-166-0630) o para ms informacin haga clic aqu or Text READY to 160-109 to register via text   Amber Willis - following are the goals we discussed in your visit today:    Goals Addressed             This Visit's Progress    BSW VBCI Social Work Care Plan   On track    Problems:   SW assessed SDOH and no issues/barriers were found. Amber Willis current issue is that her insurance will not cover a mammogram. Amber Willis needs help figuring out why or other options. Amber Willis also wants to apply for food stamps and medicaid.   CSW Clinical Goal(s):   Over the next 3 weeks the Patient will work with Child psychotherapist to address concerns related to insurance covering mammogram.  Interventions:  SW  will call insurance provider to request information on coverage for mammogram. SW will follow-up with Amber Willis.   Patient Goals/Self-Care Activities:  Visit local Department of Social Services office to apply for food stamps and medicaid.   Plan:   Telephone follow up appointment with care management team member scheduled for:  07/13/2023 at 2pm.          The patient verbalized understanding of instructions, educational materials, and care plan provided today and DECLINED offer to receive copy of patient instructions, educational materials, and care plan.   Social Worker will follow-up with patient via telephone call on 07/13/2023 at [facility]..   Burt Casco, BSW Long/VBCI - [employer] Social Worker [phone]   Following is a copy of your plan of care:  There are no care plans that you recently modified to display for this patient.

## 2023-06-22 NOTE — Patient Outreach (Signed)
 Complex Care Management   Visit Note  06/22/2023  Name:  Amber Willis MRN: [id number] DOB: 01-25-1960  Situation: Referral received for Complex Care Management related to mammogram insurance coverage I obtained verbal consent from Patient.  Visit completed with patient.  on the phone  Background:   Past Medical History:  Diagnosis Date   Abnormal TSH    But normal T4.    Anxiety    on meds   Arthritis    bilateral knees   Depression    on meds   Flat foot(734)    GERD (gastroesophageal reflux disease)    OTC PRN-diet controlled    History of abnormal cervical Pap smear 03/29/2017   History of sexually transmitted disease 03/29/2017   Hyperlipidemia    on meds   Hypertension    on meds   Leg pain, posterior, left 03/03/2020   MENOPAUSAL SYNDROME 03/16/2006   Thyroid  disease    not on meds at this time (02/24/2020)    Assessment: SW called Pt on the phone. Pt was alert and cognitive. SW assessed SDOH and no issues/barriers were identified. However, SW offered Pt food resources to patient in the event she needs them in the future. Pt agreed and resources will be mailed out. When asked about food stamps Pt states she currently does not receive any and admits applying a long time ago. SW encouraged her to apply if she felt the need to. Pt agreed to visit local DSS office on 06/11 to ask about eligibility and apply for food stamps and medicaid. Pt confirmed she knew where her local DSS office is at and uses the city bus as her main transportation. Pt reports to be living with a friend and shares household expenses. No other resources needed at this time.  Pt states she visited the breast center in [place] for a mammogram appointment and was told her insurance does not cover the mammogram. Pt was given 3 different phone numbers to call and ask insurance, but had no success in getting an answer. SW will call insurance on file and attempt to obtain an answer/clarification before exploring  other options. Pt understood and agreed.   SDOH Interventions    Flowsheet Row Patient Outreach Telephone from 06/22/2023 in [place] POPULATION HEALTH DEPARTMENT Office Visit from 02/08/2021 in [facility] - [facility] Dept Of [place]. [facility]  SDOH Interventions    Food Insecurity Interventions Community Resources Provided --  Housing Interventions Intervention Not Indicated --  Transportation Interventions Intervention Not Indicated --  Utilities Interventions Intervention Not Indicated --  Depression Interventions/Treatment  -- Medication         Recommendation:   Pt visit local DSS office to attempt to apply for food stamps and medicaid while SW figures out why [employer] will not cover her mammogram. SW to follow-up with Pt.   Follow Up Plan:   Telephone follow up appointment date/time:  07/13/2023 at [facility]  Burt Casco, [place]/VBCI - [facility] Social Worker [phone]

## 2023-06-26 ENCOUNTER — Encounter: Payer: Self-pay | Admitting: Student

## 2023-06-27 ENCOUNTER — Encounter: Payer: Self-pay | Admitting: *Deleted

## 2023-06-27 ENCOUNTER — Other Ambulatory Visit: Payer: Self-pay | Admitting: Student

## 2023-06-27 DIAGNOSIS — M62838 Other muscle spasm: Secondary | ICD-10-CM

## 2023-07-13 ENCOUNTER — Other Ambulatory Visit: Payer: Self-pay

## 2023-07-13 NOTE — Patient Instructions (Signed)
 Visit Information  Amber Willis was given information about Medicaid Managed Care team care coordination services as a part of their [employer] Medicaid benefit. Amber Willis verbally consentedto engagement with the [facility] Managed Care team.   If you are experiencing a medical emergency, please call 911 or report to your local emergency department or urgent care.   If you have a non-emergency medical problem during routine business hours, please contact your provider's office and ask to speak with a nurse.   For questions related to your [employer] [facility] health plan, please call: [phone]  OR visit the member homepage at: [email or web address]  If you would like to schedule transportation through your [facility] plan, please call the following number at least 2 days in advance of your appointment: [phone]  If you are experiencing a behavioral health crisis, call the [employer] [place]  Behavioral Health Crisis Line at [phone]). The line is available 24 hours a day, seven days a week.  If you would like help to quit smoking, call 1-800-QUIT-NOW (272 720 1119) OR Espaol: 1-855-Djelo-Ya (8-144-664-6430) o para ms informacin haga clic aqu or Text READY to 799-599 to register via text   Amber Willis - following are the goals we discussed in your visit today:    Goals Addressed   None      Patient verbalizes understanding of instructions and care plan provided today and agrees to view in MyChart. Active MyChart status and patient understanding of how to access instructions and care plan via MyChart confirmed with patient.     Telephone follow up appointment with Managed Medicaid care management team member scheduled for: 07/26/2023 at 3pm.  Amber Willis, BSW [place]/VBCI - [facility] Social Worker [phone]   Following is a copy of your  plan of care:  There are no care plans that you recently modified to display for this patient.

## 2023-07-13 NOTE — Patient Outreach (Signed)
 Complex Care Management   Visit Note  07/13/2023  Name:  Amber Willis MRN: [id number] DOB: June 19, 1960  Situation: Referral received for Complex Care Management related to find provider that accepts her health insurance for a mammogram. I obtained verbal consent from Patient.  Visit completed with patient  on the phone  Background:   Past Medical History:  Diagnosis Date   Abnormal TSH    But normal T4.    Anxiety    on meds   Arthritis    bilateral knees   Depression    on meds   Flat foot(734)    GERD (gastroesophageal reflux disease)    OTC PRN-diet controlled    History of abnormal cervical Pap smear 03/29/2017   History of sexually transmitted disease 03/29/2017   Hyperlipidemia    on meds   Hypertension    on meds   Leg pain, posterior, left 03/03/2020   MENOPAUSAL SYNDROME 03/16/2006   Thyroid  disease    not on meds at this time (02/24/2020)    Assessment: SW called patient for follow-up call. Pt was alert and cognitive. Pt informed SW she was able to visit local DSS office and apply for Medicaid. Pt states she also tried applying for Food stamps but needed additional paperwork, so that application is pending for completion. Pt states she received a paper Medicaid card in the mail with her name on it and was told she was approved. Pt was unsure of her [employer] provider was still active. So, SW and Pt called provider together and received confirmation that her provider with [employer] is active and paid out until December 2025. Pt asked about why her mammogram appointment with the [facility] was not approved, and insurance rep advised that specific provider was not in contract with them. Pt was given two different providers [facility]  & [facility]) that were contracted under [employer]. Upon calling those providers together with Pt, she was told they did not take [employer]. Pt and SW will continue to  work together to find out what providers do accept her insurance. In the meantime, Pt was provided contact info for [facility] which provides free/low-cost mammograms. SW will attempt to figure out if Pt has medicaid/accurate provider list for [employer]. Pt understood and confirmed no additional resources needed at this time.   SDOH Interventions    Flowsheet Row Patient Outreach from 07/13/2023 in [place] POPULATION HEALTH DEPARTMENT Patient Outreach Telephone from 06/22/2023 in [place] POPULATION HEALTH DEPARTMENT Office Visit from 02/08/2021 in [facility] - A Dept Of [place]. [facility]  SDOH Interventions     Food Insecurity Interventions Community Resources Provided Community Resources Provided --  Housing Interventions Intervention Not Indicated Intervention Not Indicated --  Transportation Interventions Intervention Not Indicated Intervention Not Indicated --  Utilities Interventions -- Intervention Not Indicated --  Depression Interventions/Treatment  -- -- Medication  Financial Strain Interventions Intervention Not Indicated -- --      Recommendation:   Identify providers that accept [employer] Next for mammogram.  Follow Up Plan:   Telephone follow up appointment date/time:  07/26/2023 3pm  Laymon Doll, BSW [place]/VBCI - [facility] Social Worker [phone]

## 2023-07-26 ENCOUNTER — Other Ambulatory Visit: Payer: Self-pay

## 2023-08-02 ENCOUNTER — Other Ambulatory Visit: Payer: Self-pay

## 2023-08-02 NOTE — Patient Outreach (Signed)
 Complex Care Management   Visit Note  08/02/2023  Name:  Amber Willis MRN: [id number] DOB: 05-04-1960  Situation: Referral received for Complex Care Management related to patient needing mammogram I obtained verbal consent from Patient.  Visit completed with patient  on the phone  Background:   Past Medical History:  Diagnosis Date   Abnormal TSH    But normal T4.    Anxiety    on meds   Arthritis    bilateral knees   Depression    on meds   Flat foot(734)    GERD (gastroesophageal reflux disease)    OTC PRN-diet controlled    History of abnormal cervical Pap smear 03/29/2017   History of sexually transmitted disease 03/29/2017   Hyperlipidemia    on meds   Hypertension    on meds   Leg pain, posterior, left 03/03/2020   MENOPAUSAL SYNDROME 03/16/2006   Thyroid  disease    not on meds at this time (02/24/2020)    Assessment: BSW called patient for f/u. Patient was alert and cognitive. Patient states she is frustrated that she cannot find a place where she can provide her insurance and get a mammogram. Patient reports she has came to the conclusion of cancelling her [employer] insurance and explore other options. BSW recommended patient to inform herself of other options before canceling her current insurance. Patient reports she received a medicaid card in the mail but not sure if it covers anything. Patient  provide case worker contact info: Thersia Kitty [phone] and BSW will be reaching out to confirm.  BSW asked patient if she would consider mobile mammography units but patient declined due to being claustrophobic. No other resources provided/needed at this time.   SDOH Interventions    Flowsheet Row Patient Outreach Telephone from 08/02/2023 in [place] POPULATION HEALTH DEPARTMENT Patient Outreach from 07/13/2023 in [place] POPULATION HEALTH DEPARTMENT Patient Outreach Telephone from 06/22/2023 in [place] POPULATION HEALTH DEPARTMENT Office Visit from  02/08/2021 in [facility] - [facility] Dept Of [place]. [facility]  SDOH Interventions      Food Insecurity Interventions Community Resources Provided Community Resources Provided [employer] Provided --  Housing Interventions Intervention Not Indicated Intervention Not Indicated Intervention Not Indicated --  Transportation Interventions Intervention Not Indicated  [bus system serves as main form of transportation.] Intervention Not Indicated Intervention Not Indicated --  Utilities Interventions Intervention Not Indicated -- Intervention Not Indicated --  Depression Interventions/Treatment  -- -- -- Medication  Financial Strain Interventions Intervention Not Indicated Intervention Not Indicated -- --      Recommendation:   none  Follow Up Plan:   Telephone follow up appointment date/time:  08/16/2023 2:15pm  Laymon Doll, BSW [place]/VBCI - [facility] Social Worker [phone]

## 2023-08-02 NOTE — Patient Instructions (Signed)
 Visit Information  Thank you for taking time to visit with me today. Please don't hesitate to contact me if I can be of assistance to you before our next scheduled appointment.  Your next care management appointment is by telephone on 07/30/205 at 2:15pm    Please call the care guide team at [phone] if you need to cancel, schedule, or reschedule an appointment.   Please call the Suicide and Crisis Lifeline: 988 go to [facility] Urgent [facility] [address] [place] [phone]) call 911 if you are experiencing a Mental Health or Behavioral Health Crisis or need someone to talk to.  Laymon Doll, BSW [place]/VBCI - [employer] Social Worker [phone]

## 2023-08-02 NOTE — Patient Outreach (Signed)
 BSW and patient called [employer] member services to inquire about in-network mammogram providers. Patient was told there is no in-network providers in [place] at this time, but there is one in [facility], which is about a 25 minute commute for the patient. Patient and member services rep agreed to hold off on scheduling appt for [facility] location. In the meantime, member services will be searching for closer in network providers and will be calling patient back or providing an update within 48 hours. Patient understood and agreed.

## 2023-08-16 ENCOUNTER — Other Ambulatory Visit: Payer: Self-pay

## 2023-08-16 NOTE — Patient Outreach (Signed)
 Complex Care Management   Visit Note  08/16/2023  Name:  Amber Willis MRN: [id number] DOB: [birth year]  Situation: Referral received for Complex Care Management related to mammogram  I obtained verbal consent from Patient.  Visit completed with patient  on the phone  Background:   Past Medical History:  Diagnosis Date   Abnormal TSH    But normal T4.    Anxiety    on meds   Arthritis    bilateral knees   Depression    on meds   Flat foot(734)    GERD (gastroesophageal reflux disease)    OTC PRN-diet controlled    History of abnormal cervical Pap smear 03/29/2017   History of sexually transmitted disease 03/29/2017   Hyperlipidemia    on meds   Hypertension    on meds   Leg pain, posterior, left 03/03/2020   MENOPAUSAL SYNDROME 03/16/2006   Thyroid  disease    not on meds at this time (02/24/2020)    Assessment: BSW held f/u call with patient. Patient was alert and cognitive. Patient and BSW spoke with [facility] at [facility] [phone]). Patient was advised she has been schedule for an in-network mammogram with [facility] located at [address], [place] [address]. Appt is scheduled for 08/31/2023 at 9:45AM. Patient was instructed on what documentation to take to her appointment and advised to arrive early. Patient understood and agreed. Patient plans to take the city bus to get to her appointment. BSW and patient reviewed together bus route and patient is aware of how to get there.   BSW and patient called [facility] and confirmed her appointment is set for 08/31/2023 at 9:45AM. Patient was given phone number to practice in case she needs to reschedule. Patient understood and agreed. No further resources were provided/requested at this time. BSW will f/u with patient to ensure appointment was complete and close case out.   SDOH Interventions    Flowsheet Row Patient  Outreach Telephone from 08/02/2023 in [place] POPULATION HEALTH DEPARTMENT Patient Outreach from 07/13/2023 in [place] POPULATION HEALTH DEPARTMENT Patient Outreach Telephone from 06/22/2023 in [place] POPULATION HEALTH DEPARTMENT Office Visit from 02/08/2021 in [facility] - [facility] Dept Of [place]. [facility]  SDOH Interventions      Food Insecurity Interventions Community Resources Provided Community Resources Provided [employer] Provided --  Housing Interventions Intervention Not Indicated Intervention Not Indicated Intervention Not Indicated --  Transportation Interventions Intervention Not Indicated  [bus system serves as main form of transportation.] Intervention Not Indicated Intervention Not Indicated --  Utilities Interventions Intervention Not Indicated -- Intervention Not Indicated --  Depression Interventions/Treatment  -- -- -- Medication  Financial Strain Interventions Intervention Not Indicated Intervention Not Indicated -- --      Recommendation:   Attend mammogram appointment for 08/31/2023 at 9:45AM  Follow Up Plan:   Telephone follow up appointment date/time:  09/01/2023 at 2pm  Laymon Doll, [place]/VBCI - [facility] Social Worker [phone]

## 2023-08-16 NOTE — Patient Instructions (Signed)
 Visit Information  Thank you for taking time to visit with me today. Please don't hesitate to contact me if I can be of assistance to you before our next scheduled appointment.  Your next care management appointment is by telephone on 09/01/2023 at 2pm  Patient has scheduled mammogram appointment for 08/31/2023 at 9:45AM.   Please call the care guide team at [phone] if you need to cancel, schedule, or reschedule an appointment.   Please call the Suicide and Crisis Lifeline: 988 go to [facility] Urgent [facility] [address], [place] [phone]) call 911 if you are experiencing a Mental Health or Behavioral Health Crisis or need someone to talk to.  Amber Willis, BSW [place]/VBCI - [employer] Social Worker [phone]

## 2023-08-24 ENCOUNTER — Other Ambulatory Visit: Payer: Self-pay

## 2023-08-24 DIAGNOSIS — F331 Major depressive disorder, recurrent, moderate: Secondary | ICD-10-CM

## 2023-08-24 MED ORDER — HYDROXYZINE HCL 10 MG PO TABS
ORAL_TABLET | ORAL | 0 refills | Status: AC
Start: 2023-08-24 — End: ?

## 2023-08-31 LAB — HM MAMMOGRAPHY

## 2023-09-01 ENCOUNTER — Telehealth: Payer: Self-pay

## 2023-09-04 ENCOUNTER — Other Ambulatory Visit: Payer: Self-pay

## 2023-09-04 NOTE — Patient Outreach (Signed)
 Complex Care Management   Visit Note  09/04/2023  Name:  Amber Willis MRN: [id number] DOB: 27-May-1960  Situation: Referral received for Complex Care Management related to patient needs mammogram I obtained verbal consent from Patient.  Visit completed with patient  on the phone  Background:   Past Medical History:  Diagnosis Date   Abnormal TSH    But normal T4.    Anxiety    on meds   Arthritis    bilateral knees   Depression    on meds   Flat foot(734)    GERD (gastroesophageal reflux disease)    OTC PRN-diet controlled    History of abnormal cervical Pap smear 03/29/2017   History of sexually transmitted disease 03/29/2017   Hyperlipidemia    on meds   Hypertension    on meds   Leg pain, posterior, left 03/03/2020   MENOPAUSAL SYNDROME 03/16/2006   Thyroid  disease    not on meds at this time (02/24/2020)    Assessment: BSW held f/u appt with pt. Pt was doing well and was alert and cognitive. Pt reports she was able to obtain a mammogram through [facility] in [place], [place] and confirmed she did not have to pay anything for this service. Pt reports she has not received her results yet. Pt states she is doing fine and does not need additional resources at this time. Pt was informed how she can get connected to BSW services in the future should her needs change. No additional resources were requested/provided at this time.   SDOH Interventions    Flowsheet Row Patient Outreach Telephone from 08/02/2023 in [place] POPULATION HEALTH DEPARTMENT Patient Outreach from 07/13/2023 in [place] POPULATION HEALTH DEPARTMENT Patient Outreach Telephone from 06/22/2023 in [place] POPULATION HEALTH DEPARTMENT Office Visit from 02/08/2021 in [facility] - A Dept Of [place]. [facility]  SDOH Interventions      Food Insecurity Interventions Community Resources Provided Community Resources Provided [employer] Provided --  Housing  Interventions Intervention Not Indicated Intervention Not Indicated Intervention Not Indicated --  Transportation Interventions Intervention Not Indicated  [bus system serves as main form of transportation.] Intervention Not Indicated Intervention Not Indicated --  Utilities Interventions Intervention Not Indicated -- Intervention Not Indicated --  Depression Interventions/Treatment  -- -- -- Medication  Financial Strain Interventions Intervention Not Indicated Intervention Not Indicated -- --      Recommendation:   None.  Follow Up Plan:   Patient has met all care management goals. Care Management case will be closed. Patient has been provided contact information should new needs arise.   Laymon Doll, BSW /VBCI - [employer] Social Worker [phone]

## 2023-09-04 NOTE — Patient Instructions (Signed)

## 2023-09-22 ENCOUNTER — Other Ambulatory Visit: Payer: Self-pay

## 2023-09-22 DIAGNOSIS — M62838 Other muscle spasm: Secondary | ICD-10-CM

## 2023-09-25 MED ORDER — TIZANIDINE HCL 4 MG PO TABS
4.0000 mg | ORAL_TABLET | Freq: Three times a day (TID) | ORAL | 0 refills | Status: AC | PRN
Start: 2023-09-25 — End: ?
